# Patient Record
Sex: Female | Born: 1975 | Hispanic: No | Marital: Married | Smoking: Never smoker
Health system: Southern US, Community
[De-identification: ages and names within clinical notes are randomized; demographics above are authoritative.]

## PROBLEM LIST (undated history)

## (undated) DIAGNOSIS — B354 Tinea corporis: Secondary | ICD-10-CM

## (undated) DIAGNOSIS — G5603 Carpal tunnel syndrome, bilateral upper limbs: Secondary | ICD-10-CM

## (undated) DIAGNOSIS — D649 Anemia, unspecified: Secondary | ICD-10-CM

## (undated) DIAGNOSIS — N200 Calculus of kidney: Secondary | ICD-10-CM

## (undated) DIAGNOSIS — R109 Unspecified abdominal pain: Secondary | ICD-10-CM

## (undated) DIAGNOSIS — R1032 Left lower quadrant pain: Secondary | ICD-10-CM

## (undated) DIAGNOSIS — N750 Cyst of Bartholin's gland: Secondary | ICD-10-CM

## (undated) DIAGNOSIS — Z789 Other specified health status: Secondary | ICD-10-CM

## (undated) DIAGNOSIS — F99 Mental disorder, not otherwise specified: Secondary | ICD-10-CM

## (undated) DIAGNOSIS — J309 Allergic rhinitis, unspecified: Secondary | ICD-10-CM

## (undated) DIAGNOSIS — R140 Abdominal distension (gaseous): Secondary | ICD-10-CM

## (undated) DIAGNOSIS — N390 Urinary tract infection, site not specified: Secondary | ICD-10-CM

## (undated) HISTORY — DX: Cyst of Bartholin's gland: N75.0

## (undated) HISTORY — DX: Left lower quadrant pain: R10.32

## (undated) HISTORY — DX: Allergic rhinitis, unspecified: J30.9

## (undated) HISTORY — DX: Unspecified abdominal pain: R10.9

## (undated) HISTORY — DX: Tinea corporis: B35.4

## (undated) HISTORY — DX: Mental disorder, not otherwise specified: F99

## (undated) HISTORY — PX: KIDNEY STONE SURGERY: SHX686

## (undated) HISTORY — DX: Carpal tunnel syndrome, bilateral upper limbs: G56.03

## (undated) HISTORY — DX: Abdominal distension (gaseous): R14.0

---

## 2000-08-17 ENCOUNTER — Other Ambulatory Visit: Admission: RE | Admit: 2000-08-17 | Discharge: 2000-08-17 | Payer: Self-pay | Admitting: *Deleted

## 2001-02-25 ENCOUNTER — Inpatient Hospital Stay (HOSPITAL_COMMUNITY): Admission: AD | Admit: 2001-02-25 | Discharge: 2001-02-25 | Payer: Self-pay | Admitting: Obstetrics

## 2001-02-28 ENCOUNTER — Ambulatory Visit (HOSPITAL_COMMUNITY): Admission: RE | Admit: 2001-02-28 | Discharge: 2001-02-28 | Payer: Self-pay | Admitting: Obstetrics

## 2001-02-28 ENCOUNTER — Encounter: Payer: Self-pay | Admitting: Obstetrics

## 2001-05-09 ENCOUNTER — Inpatient Hospital Stay (HOSPITAL_COMMUNITY): Admission: AD | Admit: 2001-05-09 | Discharge: 2001-05-09 | Payer: Self-pay | Admitting: *Deleted

## 2001-07-03 ENCOUNTER — Inpatient Hospital Stay (HOSPITAL_COMMUNITY): Admission: AD | Admit: 2001-07-03 | Discharge: 2001-07-06 | Payer: Self-pay | Admitting: Obstetrics and Gynecology

## 2001-08-06 ENCOUNTER — Inpatient Hospital Stay (HOSPITAL_COMMUNITY): Admission: AD | Admit: 2001-08-06 | Discharge: 2001-08-06 | Payer: Self-pay | Admitting: *Deleted

## 2003-09-04 ENCOUNTER — Other Ambulatory Visit: Admission: RE | Admit: 2003-09-04 | Discharge: 2003-09-04 | Payer: Self-pay | Admitting: Family Medicine

## 2005-12-27 ENCOUNTER — Ambulatory Visit: Payer: Self-pay | Admitting: Family Medicine

## 2006-01-29 ENCOUNTER — Ambulatory Visit: Payer: Self-pay | Admitting: Family Medicine

## 2006-04-18 ENCOUNTER — Ambulatory Visit: Payer: Self-pay | Admitting: Family Medicine

## 2006-04-27 ENCOUNTER — Ambulatory Visit: Payer: Self-pay | Admitting: *Deleted

## 2006-05-22 ENCOUNTER — Ambulatory Visit: Payer: Self-pay | Admitting: Family Medicine

## 2007-01-16 ENCOUNTER — Encounter (INDEPENDENT_AMBULATORY_CARE_PROVIDER_SITE_OTHER): Payer: Self-pay | Admitting: *Deleted

## 2010-05-01 NOTE — L&D Delivery Note (Signed)
Operative Delivery Note At  a viable female was delivered via NSVD.  Presentation: vertex; Position: Occiput,, Anterior;  Delivery of the head:   First maneuver- McRobert's Second maneuver: suprapubic,   Third maneuver: delivery of posterior arm ,   Dystocia lasted approximately 30 sec.  APGAR: 6, 9; weight 8lb 4oz.   Placenta status:  Cord:  3 cord vessel  Lacerations: periurethral laceration no sutures needed Est. Blood Loss (mL): <500cc  Mom to postpartum.  Baby to with mother in room- stable.  Sid Falcon, CNM attending  CAVINESS,DAWN 11/25/2010, 6:56 PM  I was present for above delivery and agree with note above. [facility]

## 2010-06-20 ENCOUNTER — Encounter (HOSPITAL_COMMUNITY): Payer: Self-pay | Admitting: Radiology

## 2010-06-20 ENCOUNTER — Inpatient Hospital Stay (HOSPITAL_COMMUNITY): Payer: Medicaid Other

## 2010-06-20 ENCOUNTER — Inpatient Hospital Stay (HOSPITAL_COMMUNITY)
Admission: AD | Admit: 2010-06-20 | Discharge: 2010-06-20 | Disposition: A | Discharge status: Home / Self Care | Payer: Medicaid Other | Source: Ambulatory Visit | Attending: Obstetrics & Gynecology | Admitting: Obstetrics & Gynecology

## 2010-06-20 DIAGNOSIS — O9989 Other specified diseases and conditions complicating pregnancy, childbirth and the puerperium: Secondary | ICD-10-CM

## 2010-06-20 DIAGNOSIS — O09529 Supervision of elderly multigravida, unspecified trimester: Secondary | ICD-10-CM

## 2010-06-20 DIAGNOSIS — O0930 Supervision of pregnancy with insufficient antenatal care, unspecified trimester: Secondary | ICD-10-CM

## 2010-06-20 DIAGNOSIS — O99891 Other specified diseases and conditions complicating pregnancy: Secondary | ICD-10-CM | POA: Insufficient documentation

## 2010-06-20 LAB — URINALYSIS, ROUTINE W REFLEX MICROSCOPIC
Bilirubin Urine: NEGATIVE
Hgb urine dipstick: NEGATIVE
Ketones, ur: NEGATIVE mg/dL
Nitrite: NEGATIVE
Protein, ur: NEGATIVE mg/dL
Specific Gravity, Urine: 1.015 (ref 1.005–1.030)
Urine Glucose, Fasting: NEGATIVE mg/dL
Urobilinogen, UA: 1 mg/dL (ref 0.0–1.0)
pH: 8 (ref 5.0–8.0)

## 2010-06-20 LAB — WET PREP, GENITAL
Clue Cells Wet Prep HPF POC: NONE SEEN
Trich, Wet Prep: NONE SEEN
Yeast Wet Prep HPF POC: NONE SEEN

## 2010-06-21 LAB — GC/CHLAMYDIA PROBE AMP, GENITAL
Chlamydia, DNA Probe: NEGATIVE
GC Probe Amp, Genital: NEGATIVE

## 2010-07-18 ENCOUNTER — Other Ambulatory Visit: Payer: Self-pay | Admitting: Family Medicine

## 2010-07-18 DIAGNOSIS — Z3689 Encounter for other specified antenatal screening: Secondary | ICD-10-CM

## 2010-07-19 ENCOUNTER — Ambulatory Visit (HOSPITAL_COMMUNITY)
Admission: RE | Admit: 2010-07-19 | Discharge: 2010-07-19 | Disposition: A | Discharge status: Home / Self Care | Payer: Medicaid Other | Source: Ambulatory Visit | Attending: Family Medicine | Admitting: Family Medicine

## 2010-07-19 DIAGNOSIS — Z3689 Encounter for other specified antenatal screening: Secondary | ICD-10-CM | POA: Insufficient documentation

## 2010-07-19 DIAGNOSIS — O09529 Supervision of elderly multigravida, unspecified trimester: Secondary | ICD-10-CM | POA: Insufficient documentation

## 2010-07-26 ENCOUNTER — Ambulatory Visit (HOSPITAL_COMMUNITY)
Admission: RE | Admit: 2010-07-26 | Discharge: 2010-07-26 | Disposition: A | Discharge status: Home / Self Care | Payer: Medicaid Other | Source: Ambulatory Visit | Attending: Family Medicine | Admitting: Family Medicine

## 2010-08-29 ENCOUNTER — Other Ambulatory Visit: Payer: Self-pay | Admitting: Family Medicine

## 2010-08-29 DIAGNOSIS — O3660X0 Maternal care for excessive fetal growth, unspecified trimester, not applicable or unspecified: Secondary | ICD-10-CM

## 2010-08-29 DIAGNOSIS — Z87442 Personal history of urinary calculi: Secondary | ICD-10-CM

## 2010-08-30 ENCOUNTER — Ambulatory Visit (HOSPITAL_COMMUNITY)
Admission: RE | Admit: 2010-08-30 | Discharge: 2010-08-30 | Disposition: A | Discharge status: Home / Self Care | Payer: Self-pay | Source: Ambulatory Visit | Attending: Family Medicine | Admitting: Family Medicine

## 2010-08-30 DIAGNOSIS — O3660X0 Maternal care for excessive fetal growth, unspecified trimester, not applicable or unspecified: Secondary | ICD-10-CM

## 2010-08-30 DIAGNOSIS — Z87442 Personal history of urinary calculi: Secondary | ICD-10-CM

## 2010-08-30 DIAGNOSIS — M549 Dorsalgia, unspecified: Secondary | ICD-10-CM | POA: Insufficient documentation

## 2010-08-30 DIAGNOSIS — K8020 Calculus of gallbladder without cholecystitis without obstruction: Secondary | ICD-10-CM | POA: Insufficient documentation

## 2010-09-19 ENCOUNTER — Inpatient Hospital Stay (HOSPITAL_COMMUNITY)
Admission: AD | Admit: 2010-09-19 | Discharge: 2010-09-22 | DRG: 778 | Disposition: A | Discharge status: Home / Self Care | Payer: Medicaid Other | Source: Ambulatory Visit | Attending: Obstetrics and Gynecology | Admitting: Obstetrics and Gynecology

## 2010-09-19 DIAGNOSIS — O09529 Supervision of elderly multigravida, unspecified trimester: Secondary | ICD-10-CM

## 2010-09-19 DIAGNOSIS — O4700 False labor before 37 completed weeks of gestation, unspecified trimester: Secondary | ICD-10-CM

## 2010-09-19 LAB — CBC
HCT: 34.2 % — ABNORMAL LOW (ref 36.0–46.0)
Hemoglobin: 11.2 g/dL — ABNORMAL LOW (ref 12.0–15.0)
MCH: 29.9 pg (ref 26.0–34.0)
MCHC: 32.7 g/dL (ref 30.0–36.0)
MCV: 91.2 fL (ref 78.0–100.0)
Platelets: 144 10*3/uL — ABNORMAL LOW (ref 150–400)
RBC: 3.75 MIL/uL — ABNORMAL LOW (ref 3.87–5.11)
RDW: 14.7 % (ref 11.5–15.5)
WBC: 10.5 10*3/uL (ref 4.0–10.5)

## 2010-09-19 LAB — URINALYSIS, ROUTINE W REFLEX MICROSCOPIC
Bilirubin Urine: NEGATIVE
Glucose, UA: NEGATIVE mg/dL
Ketones, ur: NEGATIVE mg/dL
Leukocytes, UA: NEGATIVE
Nitrite: NEGATIVE
Protein, ur: NEGATIVE mg/dL
Specific Gravity, Urine: 1.01 (ref 1.005–1.030)
Urobilinogen, UA: 0.2 mg/dL (ref 0.0–1.0)
pH: 6.5 (ref 5.0–8.0)

## 2010-09-19 LAB — URINE MICROSCOPIC-ADD ON

## 2010-09-19 LAB — RAPID URINE DRUG SCREEN, HOSP PERFORMED
Amphetamines: NOT DETECTED
Benzodiazepines: NOT DETECTED
Cocaine: NOT DETECTED
Opiates: NOT DETECTED
Tetrahydrocannabinol: NOT DETECTED

## 2010-09-19 LAB — WET PREP, GENITAL

## 2010-09-19 LAB — COMPREHENSIVE METABOLIC PANEL
ALT: 17 U/L (ref 0–35)
AST: 24 U/L (ref 0–37)
Albumin: 2.8 g/dL — ABNORMAL LOW (ref 3.5–5.2)
Alkaline Phosphatase: 77 U/L (ref 39–117)
BUN: 6 mg/dL (ref 6–23)
CO2: 22 mEq/L (ref 19–32)
Calcium: 9.5 mg/dL (ref 8.4–10.5)
Chloride: 96 mEq/L (ref 96–112)
Creatinine, Ser: <0.47 mg/dL (ref 0.4–1.2)
Glucose, Bld: 84 mg/dL (ref 70–99)
Potassium: 3.4 mEq/L — ABNORMAL LOW (ref 3.5–5.1)
Sodium: 135 mEq/L (ref 135–145)
Total Bilirubin: 0.7 mg/dL (ref 0.3–1.2)
Total Protein: 6.2 g/dL (ref 6.0–8.3)

## 2010-09-19 LAB — FETAL FIBRONECTIN: Fetal Fibronectin: NEGATIVE

## 2010-09-21 LAB — URINE CULTURE
Colony Count: 2000
Culture  Setup Time: 201205220212

## 2010-09-21 LAB — STREP B DNA PROBE

## 2010-09-29 ENCOUNTER — Other Ambulatory Visit: Payer: Self-pay | Admitting: Obstetrics and Gynecology

## 2010-09-29 DIAGNOSIS — O479 False labor, unspecified: Secondary | ICD-10-CM

## 2010-09-29 DIAGNOSIS — Z331 Pregnant state, incidental: Secondary | ICD-10-CM

## 2010-09-29 LAB — POCT URINALYSIS DIP (DEVICE)
Bilirubin Urine: NEGATIVE
Nitrite: NEGATIVE
pH: 7 (ref 5.0–8.0)

## 2010-09-29 NOTE — Discharge Summary (Addendum)
  NAMEKAYDI, Colleen Warner             ACCOUNT NO.:  [id number]  MEDICAL RECORD NO.:  [id number]           PATIENT TYPE:  I  LOCATION:  9157                          FACILITY:  [facility]  PHYSICIAN:  Horton Chin, MD DATE OF BIRTH:  1976-03-11  DATE OF ADMISSION:  09/19/2010 DATE OF DISCHARGE:  09/22/2010                              DISCHARGE SUMMARY   ADMISSION DIAGNOSES: 1. Intrauterine pregnancy at 29 weeks and 5 days. 2. Threatened preterm labor.  DISCHARGE DIAGNOSES: 1. Intrauterine pregnancy at 30 weeks and 2 days. 2. Threatened preterm labor, resolved.  DISCHARGE MEDICATIONS:  Procardia XL 30 mg 1 tab p.o. b.i.d.  PROCEDURES:  There were no procedures that were performed.  CONSULTATIONS:  There were none.  HISTORY OF PRESENT ILLNESS:  This is a 35 year old gravida 3, para 2 with an intrauterine pregnancy at 29 weeks and 5 days, who presents with threatened preterm labor.  She has a history of 2 term previous vaginal deliveries.  On admission, her cervix was not dilated, but she continued to have contractions despite IV hydration.  Her fetal fibronectin was negative.  All the rest of her labs remained within normal limits.  She was started on magnesium for 48 hours and she did receive betamethasone x2.  Magnesium was subsequently stopped on the morning of Sep 21, 2010. Her contractions then resumed that evening.  Her cervix was then at that time 1-cm dilated, 25% effaced, and -3 station.  Fetal heart rate tracings remained reassuring, so she was then started on Procardia. Then, her contractions decreased significantly.  She was not having any pain.  She was discharged home in stable condition.  DISPOSITION:  Discharged home.  DISCHARGE CONDITION:  Stable.  FOLLOWUP:  The patient is to follow up [facility] on Sep 29, 2010, at 9:45.  ER WARNINGS:  The patient will return to the emergency department with any fever, chills, nausea, vomiting, contractions,  bleeding, spotting, decreased fetal movement, or any other concerning symptoms.    ______________________________ Maryelizabeth Kaufmann, MD   ______________________________ Horton Chin, MD    LC/MEDQ  D:  09/22/2010  T:  09/22/2010  Job:  [id number]  Electronically Signed by Maryelizabeth Kaufmann MD on 09/29/2010 01:46:51 PM Electronically Signed by Jaynie Collins MD on 10/11/2010 05:01:49 PM

## 2010-10-13 ENCOUNTER — Other Ambulatory Visit: Payer: Self-pay | Admitting: Obstetrics & Gynecology

## 2010-10-13 DIAGNOSIS — O09219 Supervision of pregnancy with history of pre-term labor, unspecified trimester: Secondary | ICD-10-CM

## 2010-10-13 LAB — POCT URINALYSIS DIP (DEVICE)
Ketones, ur: NEGATIVE mg/dL
Leukocytes, UA: NEGATIVE
Protein, ur: NEGATIVE mg/dL
Specific Gravity, Urine: 1.015 (ref 1.005–1.030)
pH: 7 (ref 5.0–8.0)

## 2010-10-27 ENCOUNTER — Other Ambulatory Visit: Payer: Self-pay | Admitting: Obstetrics & Gynecology

## 2010-10-27 ENCOUNTER — Other Ambulatory Visit: Payer: Self-pay | Admitting: Physician Assistant

## 2010-10-27 DIAGNOSIS — O3440 Maternal care for other abnormalities of cervix, unspecified trimester: Secondary | ICD-10-CM

## 2010-10-27 DIAGNOSIS — O0930 Supervision of pregnancy with insufficient antenatal care, unspecified trimester: Secondary | ICD-10-CM

## 2010-10-27 DIAGNOSIS — O3660X0 Maternal care for excessive fetal growth, unspecified trimester, not applicable or unspecified: Secondary | ICD-10-CM

## 2010-10-27 DIAGNOSIS — O2200 Varicose veins of lower extremity in pregnancy, unspecified trimester: Secondary | ICD-10-CM

## 2010-10-27 DIAGNOSIS — O09219 Supervision of pregnancy with history of pre-term labor, unspecified trimester: Secondary | ICD-10-CM

## 2010-10-27 LAB — POCT URINALYSIS DIP (DEVICE)
Protein, ur: NEGATIVE mg/dL
Specific Gravity, Urine: 1.02 (ref 1.005–1.030)
Urobilinogen, UA: 0.2 mg/dL (ref 0.0–1.0)

## 2010-11-01 ENCOUNTER — Ambulatory Visit (HOSPITAL_COMMUNITY)
Admission: RE | Admit: 2010-11-01 | Discharge: 2010-11-01 | Disposition: A | Discharge status: Home / Self Care | Payer: Self-pay | Source: Ambulatory Visit | Attending: Obstetrics & Gynecology | Admitting: Obstetrics & Gynecology

## 2010-11-01 DIAGNOSIS — Z3689 Encounter for other specified antenatal screening: Secondary | ICD-10-CM | POA: Insufficient documentation

## 2010-11-01 DIAGNOSIS — O3660X0 Maternal care for excessive fetal growth, unspecified trimester, not applicable or unspecified: Secondary | ICD-10-CM | POA: Insufficient documentation

## 2010-11-02 ENCOUNTER — Inpatient Hospital Stay (HOSPITAL_COMMUNITY)
Admission: AD | Admit: 2010-11-02 | Discharge: 2010-11-03 | Disposition: A | Discharge status: Home / Self Care | Payer: Self-pay | Source: Ambulatory Visit | Attending: Obstetrics & Gynecology | Admitting: Obstetrics & Gynecology

## 2010-11-02 ENCOUNTER — Other Ambulatory Visit: Payer: Self-pay | Admitting: Family Medicine

## 2010-11-02 DIAGNOSIS — N760 Acute vaginitis: Secondary | ICD-10-CM | POA: Insufficient documentation

## 2010-11-02 DIAGNOSIS — B9689 Other specified bacterial agents as the cause of diseases classified elsewhere: Secondary | ICD-10-CM | POA: Insufficient documentation

## 2010-11-02 DIAGNOSIS — O2390 Unspecified genitourinary tract infection in pregnancy, unspecified trimester: Secondary | ICD-10-CM | POA: Insufficient documentation

## 2010-11-02 DIAGNOSIS — A499 Bacterial infection, unspecified: Secondary | ICD-10-CM | POA: Insufficient documentation

## 2010-11-02 LAB — WET PREP, GENITAL

## 2010-11-03 ENCOUNTER — Other Ambulatory Visit: Payer: Self-pay | Admitting: Obstetrics & Gynecology

## 2010-11-03 DIAGNOSIS — O09219 Supervision of pregnancy with history of pre-term labor, unspecified trimester: Secondary | ICD-10-CM

## 2010-11-03 DIAGNOSIS — O09529 Supervision of elderly multigravida, unspecified trimester: Secondary | ICD-10-CM

## 2010-11-03 DIAGNOSIS — O2200 Varicose veins of lower extremity in pregnancy, unspecified trimester: Secondary | ICD-10-CM

## 2010-11-03 DIAGNOSIS — O0930 Supervision of pregnancy with insufficient antenatal care, unspecified trimester: Secondary | ICD-10-CM

## 2010-11-10 ENCOUNTER — Other Ambulatory Visit: Payer: Self-pay | Admitting: Family Medicine

## 2010-11-10 ENCOUNTER — Other Ambulatory Visit: Payer: Self-pay | Admitting: Obstetrics and Gynecology

## 2010-11-10 DIAGNOSIS — O09219 Supervision of pregnancy with history of pre-term labor, unspecified trimester: Secondary | ICD-10-CM

## 2010-11-10 DIAGNOSIS — O0930 Supervision of pregnancy with insufficient antenatal care, unspecified trimester: Secondary | ICD-10-CM

## 2010-11-11 LAB — CULTURE, OB URINE
Colony Count: NO GROWTH
Organism ID, Bacteria: NO GROWTH

## 2010-11-15 ENCOUNTER — Encounter (HOSPITAL_COMMUNITY): Payer: Self-pay | Admitting: *Deleted

## 2010-11-15 ENCOUNTER — Inpatient Hospital Stay (HOSPITAL_COMMUNITY)
Admission: AD | Admit: 2010-11-15 | Discharge: 2010-11-15 | Disposition: A | Discharge status: Home / Self Care | Payer: Self-pay | Source: Ambulatory Visit | Attending: Obstetrics and Gynecology | Admitting: Obstetrics and Gynecology

## 2010-11-15 DIAGNOSIS — O228X9 Other venous complications in pregnancy, unspecified trimester: Secondary | ICD-10-CM | POA: Insufficient documentation

## 2010-11-15 DIAGNOSIS — O479 False labor, unspecified: Secondary | ICD-10-CM | POA: Insufficient documentation

## 2010-11-15 DIAGNOSIS — K649 Unspecified hemorrhoids: Secondary | ICD-10-CM | POA: Insufficient documentation

## 2010-11-15 NOTE — Progress Notes (Signed)
CALLED DR Gwenlyn Saran-

## 2010-11-15 NOTE — Progress Notes (Signed)
DENIES HSV AND MRSA.  STARTED HURTING BAD AT  2300

## 2010-11-15 NOTE — Progress Notes (Signed)
USING INTERPRETER- DEBBIE

## 2010-11-15 NOTE — Progress Notes (Signed)
Pt reports per interpreter that she has had contractions since 5pm, denies bleeding or Leaking fluid. G3p2.

## 2010-11-15 NOTE — Progress Notes (Signed)
DR Gwenlyn Saran-  IN ROOM-

## 2010-11-16 NOTE — ED Provider Notes (Signed)
History   Chief Complaint:  Labor Eval   Colleen Warner is  35 y.o. [id number] Patient's last menstrual period was 01/18/2010.Marland Kitchen  Her pregnancy status is positive.  She is [redacted]w[redacted]d by early ultrasound.  She presents complaining of pain and itching with bowel movement and contractions. She describes itching and a feeling of buldging with bowel movement that started today. She denies any constipation. Denies blood per rectum. She also started feeling contractions at 5pm, worsening at 11pm tonight, feeling them every 5 to .  Her pregnancy has been complicated by an epidsode of early contractions for which she was admitted in May and given a month Rx of Procardia XL 30mg  bid.   OB History    Grav Para Term Preterm Abortions TAB SAB Ect Mult Living   3 2 2       2        No past medical history on file.  No past surgical history on file.  No family history on file.  History  Substance Use Topics  . Smoking status: Not on file  . Smokeless tobacco: Not on file  . Alcohol Use: Not on file    Allergies: No Known Allergies  No prescriptions prior to admission    Review of Systems - Negative except per HPI  Physical Exam   Blood pressure 113/66, pulse 100, temperature 99.2 F (37.3 C), temperature source Oral, resp. rate 20, height 4\' 10"  (1.473 m), weight 162 lb (73.483 kg), last menstrual period 01/18/2010.  General: General appearance - alert, well appearing, and in no distress Chest - clear to auscultation, no wheezes, rales or rhonchi, symmetric air entry Heart - normal rate, regular rhythm, normal S1, S2, no murmurs, rubs, clicks or gallops Abdomen - gravid Extremities - peripheral pulses normal, no pedal edema, no clubbing or cyanosis Rectal exam: two adjacent 1.5cm non thrombosed external hemorrhoids. Focused Gynecological Exam: SVE: 2.5/80/-1 SVE 1hr later: unchanged  Labs: No results found for this or any previous visit (from the past 24 hour(s)).  Ultrasound  Studies: (NOTE: Ultrasound reports are not yet able to be directly imported into notes.  Please "CUT" this portion of the note and either free text the report or "COPY/PASTE" from the report viewer.)  Assessment: 35 yo G3P2002 at 38wga who presents with contractions and hemorrhoids   Plan: 1. Labor eval: patient not in active labor. Pt instructed to return to hospital if increased severity of contractions, if any leakage of fluid. 2. Hemorrhoids: pt reassured of benign nature of hemorrhoids. Pt advised to use preparation H and keep soft bowel movements with increased fiber intake.  Discharge Medications: Discharge Medication List as of 11/15/2010  4:37 AM    CONTINUE these medications which have NOT CHANGED   Details  prenatal vitamin w/FE, FA (PRENATAL 1 + 1) 27-1 MG TABS Take 1 tablet by mouth daily.  , Until Discontinued, Historical Med         Marena Chancy 11/16/2010, 3:43 AM

## 2010-11-17 DIAGNOSIS — O0930 Supervision of pregnancy with insufficient antenatal care, unspecified trimester: Secondary | ICD-10-CM

## 2010-11-17 DIAGNOSIS — O09219 Supervision of pregnancy with history of pre-term labor, unspecified trimester: Secondary | ICD-10-CM

## 2010-11-18 ENCOUNTER — Other Ambulatory Visit: Payer: Self-pay | Admitting: Obstetrics & Gynecology

## 2010-11-24 ENCOUNTER — Inpatient Hospital Stay (HOSPITAL_COMMUNITY)
Admission: AD | Admit: 2010-11-24 | Discharge: 2010-11-27 | DRG: 775 | Disposition: A | Discharge status: Home / Self Care | Payer: Medicaid Other | Source: Ambulatory Visit | Attending: Obstetrics & Gynecology | Admitting: Obstetrics & Gynecology

## 2010-11-24 ENCOUNTER — Other Ambulatory Visit: Payer: Self-pay | Admitting: Obstetrics & Gynecology

## 2010-11-24 ENCOUNTER — Encounter (HOSPITAL_COMMUNITY): Payer: Self-pay

## 2010-11-24 ENCOUNTER — Inpatient Hospital Stay (HOSPITAL_COMMUNITY)
Admission: AD | Admit: 2010-11-24 | Discharge: 2010-11-24 | Disposition: A | Discharge status: Home / Self Care | Payer: Self-pay | Source: Ambulatory Visit | Attending: Obstetrics & Gynecology | Admitting: Obstetrics & Gynecology

## 2010-11-24 DIAGNOSIS — O479 False labor, unspecified: Secondary | ICD-10-CM | POA: Insufficient documentation

## 2010-11-24 DIAGNOSIS — O09219 Supervision of pregnancy with history of pre-term labor, unspecified trimester: Secondary | ICD-10-CM

## 2010-11-24 DIAGNOSIS — O2200 Varicose veins of lower extremity in pregnancy, unspecified trimester: Secondary | ICD-10-CM

## 2010-11-24 DIAGNOSIS — O471 False labor at or after 37 completed weeks of gestation: Secondary | ICD-10-CM

## 2010-11-24 DIAGNOSIS — O09529 Supervision of elderly multigravida, unspecified trimester: Secondary | ICD-10-CM | POA: Diagnosis present

## 2010-11-24 DIAGNOSIS — O3440 Maternal care for other abnormalities of cervix, unspecified trimester: Secondary | ICD-10-CM

## 2010-11-24 DIAGNOSIS — O0930 Supervision of pregnancy with insufficient antenatal care, unspecified trimester: Secondary | ICD-10-CM

## 2010-11-24 HISTORY — DX: Other specified health status: Z78.9

## 2010-11-24 HISTORY — DX: Calculus of kidney: N20.0

## 2010-11-24 HISTORY — DX: Urinary tract infection, site not specified: N39.0

## 2010-11-24 HISTORY — DX: Anemia, unspecified: D64.9

## 2010-11-24 NOTE — Progress Notes (Signed)
Monitors d/c'd per VSmith, CNM who reviewed reactive efm tracing. Pt to walk x1 hour, then recheck cervix.

## 2010-11-24 NOTE — Progress Notes (Signed)
11/24/2010 Colleen Warner  Interpreter  I assisted Maury Dus in triage

## 2010-11-24 NOTE — ED Provider Notes (Signed)
Colleen Warner is a 35 y.o. female sent to MAU from [facility] clinic for labor check. Maternal Medical History:  Reason for admission: Reason for admission: contractions.  Contractions: Onset was 3-5 hours ago.   Frequency: regular.   Duration is approximately 60 seconds.   Perceived severity is moderate.    Fetal activity: Perceived fetal activity is normal.    Prenatal Complications - Diabetes: none.    OB History    Grav Para Term Preterm Abortions TAB SAB Ect Mult Living   3 2 2       2      No past medical history on file. No past surgical history on file. Family History: family history is not on file. Social History:  does not have a smoking history on file. She does not have any smokeless tobacco history on file. Her alcohol and drug histories not on file.  Review of Systems  All other systems reviewed and are negative.  Blood pressure 121/82, pulse 111, temperature 98.2 F (36.8 C), temperature source Oral, resp. rate 16, height 4' 9.5" (1.461 m), weight 73.573 kg (162 lb 3.2 oz), last menstrual period 01/18/2010, SpO2 99.00%. Maternal Exam:  Uterine Assessment: Contraction strength is moderate.  Contraction duration is 60 seconds. Contraction frequency is regular.   Abdomen: Patient reports no abdominal tenderness. Fundal height is S=D.   Fetal presentation: vertex  Introitus: Normal vulva. Normal vagina.  Pelvis: adequate for delivery.   Cervix: Cervix evaluated by digital exam.     Fetal Exam Fetal Monitor Review: Mode: ultrasound.   Baseline rate: 140-150.  Variability: moderate (6-25 bpm).   Pattern: accelerations present and no decelerations.    Fetal State Assessment: Category I - tracings are normal.    Dilation: 4.5 Effacement (%): 80 Cervical Position: Middle Station: -2 Presentation: Vertex Exam by:: [name], CNM  Physical Exam  Constitutional: She is oriented to person, place, and time. She appears well-developed and well-nourished.   Cardiovascular: Normal rate.   Respiratory: Effort normal.  GI: Soft.  Genitourinary: Vagina normal and uterus normal.  Neurological: She is alert and oriented to person, place, and time. She has normal reflexes.  Skin: Skin is warm and dry.    Prenatal labs: ABO, Rh:  O pos Antibody:  neg Rubella:  Immune RPR:   NR HBsAg:   neg HIV:   NR GBS: NEGATIVE (05/21 2340)  1 hour GTT elevated, 3 hour GTT normal  Assessment/Plan: Minimal cervical change Gave pt option of D/C or admit considering Hx rapid labor, multip, advanced dilation, unsure. Will recheck cervix in 1 hour and decide. Report given to M. Maris Berger   [place], [place] 11/24/2010, 11:45am

## 2010-11-24 NOTE — Progress Notes (Signed)
Per interpreter, pt ROM at 2330, occassional mild contraction per pt

## 2010-11-24 NOTE — Progress Notes (Signed)
11/24/10 1028  Cervical Exam  Dilation 4.5  Effacement (%) 70  Cervical Position Posterior  Cervical Consistency Medium  Vag. Bleeding None  Clots None  Station Ballotable  Presentation Vertex  Exam by: VSmith, CNM  Membranes  Membrane Status Intact  Amount None    35 year-old G3P2002 at 39.1 sent from clinic for VE 4 cm, UC's Q 4-7 getting stronger. Denies vaginal bleeding or leaking of fluid. +FM, decreased this morning, but normal now.  Assessment: No clear change in VE. Pt in no distress w/ UC's.  Plan:  1. Recheck in 1 hour.  2. Pt may ambulate

## 2010-11-24 NOTE — ED Provider Notes (Signed)
History     Chief Complaint  Patient presents with  . Contractions   HPI Has been here for labor eval. THis is the second recheck at over 2+ hours post first check.  Pt states contractions have lessened considerably.    Allergies: No Known Allergies  Prescriptions prior to admission  Medication Sig Dispense Refill  . amoxicillin (AMOXIL) 500 MG tablet Take 500 mg by mouth 3 (three) times daily. PATIENT IS TAKING FOR 7 DAYS STARTED 11/22/10       . prenatal vitamin w/FE, FA (PRENATAL 1 + 1) 27-1 MG TABS Take 1 tablet by mouth daily.          ROS Physical Exam   Blood pressure 121/82, pulse 111, temperature 98.2 F (36.8 C), temperature source Oral, resp. rate 16, height 4' 9.5" (1.461 m), weight 162 lb 3.2 oz (73.573 kg), last menstrual period 01/18/2010, SpO2 99.00%.  Physical Exam  Dilation: 5 Effacement (%): 80 Cervical Position: Middle Station: -2 Presentation: Vertex Exam by:: Colleen Warner, CNM  No change in cervix by my exam.   MAU Course  Procedures   Assessment and Plan  Pt states she is comfortable with going home. I explained that when contractions resume, the dilation will happen much faster, so she should not delay in returning to hospital. States she lives close by.  Colleen Warner 11/24/2010, 1:08 PM

## 2010-11-24 NOTE — Progress Notes (Signed)
Pt was seen in the clinic this morning and was 4 cm. Sent to MAU for monitoring.

## 2010-11-24 NOTE — Progress Notes (Signed)
11/24/2010 Dent Plantz  Interpreter  I assisted [facility] with plan of care.

## 2010-11-25 ENCOUNTER — Encounter (HOSPITAL_COMMUNITY): Payer: Self-pay | Admitting: *Deleted

## 2010-11-25 DIAGNOSIS — O09529 Supervision of elderly multigravida, unspecified trimester: Secondary | ICD-10-CM

## 2010-11-25 DIAGNOSIS — N390 Urinary tract infection, site not specified: Secondary | ICD-10-CM | POA: Insufficient documentation

## 2010-11-25 LAB — CULTURE, OB URINE: Colony Count: >=100000

## 2010-11-25 LAB — HEPATITIS B SURFACE ANTIGEN: Hepatitis B Surface Ag: NEGATIVE

## 2010-11-25 LAB — ABO/RH: ABO/RH(D): O POS

## 2010-11-25 LAB — CBC
HCT: 36.3 % (ref 36.0–46.0)
Hemoglobin: 12.2 g/dL (ref 12.0–15.0)
MCH: 30.6 pg (ref 26.0–34.0)
MCHC: 33.6 g/dL (ref 30.0–36.0)
MCV: 91 fL (ref 78.0–100.0)
RBC: 3.99 MIL/uL (ref 3.87–5.11)

## 2010-11-25 LAB — RUBELLA ANTIBODY, IGM: Rubella: IMMUNE

## 2010-11-25 MED ORDER — HYDROXYZINE HCL 50 MG PO TABS
50.0000 mg | ORAL_TABLET | Freq: Four times a day (QID) | ORAL | Status: DC | PRN
  Filled 2010-11-25: qty 1

## 2010-11-25 MED ORDER — LACTATED RINGERS IV SOLN: 500.0000 mL | INTRAVENOUS | Status: DC | PRN

## 2010-11-25 MED ORDER — BENZOCAINE-MENTHOL 20-0.5 % EX AERO
INHALATION_SPRAY | CUTANEOUS | Status: AC
  Administered 2010-11-25: 1 via TOPICAL
  Filled 2010-11-25: qty 56

## 2010-11-25 MED ORDER — IBUPROFEN 600 MG PO TABS: 600.0000 mg | ORAL_TABLET | Freq: Four times a day (QID) | ORAL | Status: DC | PRN

## 2010-11-25 MED ORDER — BENZOCAINE-MENTHOL 20-0.5 % EX AERO
1.0000 "application " | INHALATION_SPRAY | CUTANEOUS | Status: DC | PRN
  Administered 2010-11-25: 1 via TOPICAL

## 2010-11-25 MED ORDER — SIMETHICONE 80 MG PO CHEW: 80.0000 mg | CHEWABLE_TABLET | ORAL | Status: DC | PRN

## 2010-11-25 MED ORDER — FLEET ENEMA 7-19 GM/118ML RE ENEM: 1.0000 | ENEMA | RECTAL | Status: DC | PRN

## 2010-11-25 MED ORDER — NALBUPHINE SYRINGE 5 MG/0.5 ML: 5.0000 mg | INJECTION | INTRAMUSCULAR | Status: DC | PRN

## 2010-11-25 MED ORDER — PENICILLIN G POTASSIUM 5000000 UNITS IJ SOLR
2.5000 10*6.[IU] | INTRAVENOUS | Status: DC
  Filled 2010-11-25 (×4): qty 2.5

## 2010-11-25 MED ORDER — ZOLPIDEM TARTRATE 5 MG PO TABS: 5.0000 mg | ORAL_TABLET | Freq: Every evening | ORAL | Status: DC | PRN

## 2010-11-25 MED ORDER — OXYTOCIN 20 UNITS IN LACTATED RINGERS INFUSION - SIMPLE
1.0000 m[IU]/min | INTRAVENOUS | Status: DC
  Administered 2010-11-25: 12 m[IU]/min via INTRAVENOUS

## 2010-11-25 MED ORDER — HYDROXYZINE HCL 50 MG/ML IM SOLN: 50.0000 mg | Freq: Four times a day (QID) | INTRAMUSCULAR | Status: DC | PRN

## 2010-11-25 MED ORDER — ONDANSETRON HCL 4 MG PO TABS: 4.0000 mg | ORAL_TABLET | ORAL | Status: DC | PRN

## 2010-11-25 MED ORDER — OXYCODONE-ACETAMINOPHEN 5-325 MG PO TABS: 2.0000 | ORAL_TABLET | ORAL | Status: DC | PRN

## 2010-11-25 MED ORDER — WITCH HAZEL-GLYCERIN EX PADS: MEDICATED_PAD | CUTANEOUS | Status: DC | PRN

## 2010-11-25 MED ORDER — LACTATED RINGERS IV SOLN
INTRAVENOUS | Status: DC
  Administered 2010-11-25 (×3): via INTRAVENOUS

## 2010-11-25 MED ORDER — EPHEDRINE 5 MG/ML INJ: 10.0000 mg | INTRAVENOUS | Status: DC | PRN

## 2010-11-25 MED ORDER — DIPHENHYDRAMINE HCL 50 MG/ML IJ SOLN: 12.5000 mg | INTRAMUSCULAR | Status: DC | PRN

## 2010-11-25 MED ORDER — PHENYLEPHRINE 40 MCG/ML (10ML) SYRINGE FOR IV PUSH (FOR BLOOD PRESSURE SUPPORT)
80.0000 ug | PREFILLED_SYRINGE | INTRAVENOUS | Status: DC | PRN
  Filled 2010-11-25: qty 5

## 2010-11-25 MED ORDER — OXYCODONE-ACETAMINOPHEN 5-325 MG PO TABS: 1.0000 | ORAL_TABLET | ORAL | Status: DC | PRN

## 2010-11-25 MED ORDER — ONDANSETRON HCL 4 MG/2ML IJ SOLN: 4.0000 mg | Freq: Four times a day (QID) | INTRAMUSCULAR | Status: DC | PRN

## 2010-11-25 MED ORDER — OXYTOCIN 20 UNITS IN LACTATED RINGERS INFUSION - SIMPLE: 125.0000 mL/h | Freq: Once | INTRAVENOUS | Status: DC

## 2010-11-25 MED ORDER — TETANUS-DIPHTH-ACELL PERTUSSIS 5-2.5-18.5 LF-MCG/0.5 IM SUSP
0.5000 mL | Freq: Once | INTRAMUSCULAR | Status: AC
  Administered 2010-11-26: 0.5 mL via INTRAMUSCULAR
  Filled 2010-11-25: qty 0.5

## 2010-11-25 MED ORDER — LACTATED RINGERS IV SOLN
500.0000 mL | INTRAVENOUS | Status: DC | PRN
  Administered 2010-11-25: 500 mL via INTRAVENOUS

## 2010-11-25 MED ORDER — LANOLIN HYDROUS EX OINT: TOPICAL_OINTMENT | CUTANEOUS | Status: DC | PRN

## 2010-11-25 MED ORDER — PRENATAL PLUS 27-1 MG PO TABS
1.0000 | ORAL_TABLET | Freq: Every day | ORAL | Status: DC
  Administered 2010-11-26 – 2010-11-27 (×2): 1 via ORAL
  Filled 2010-11-25 (×2): qty 1

## 2010-11-25 MED ORDER — ZOLPIDEM TARTRATE 10 MG PO TABS: 10.0000 mg | ORAL_TABLET | Freq: Every evening | ORAL | Status: DC | PRN

## 2010-11-25 MED ORDER — ONDANSETRON HCL 4 MG/2ML IJ SOLN: 4.0000 mg | INTRAMUSCULAR | Status: DC | PRN

## 2010-11-25 MED ORDER — LIDOCAINE HCL (PF) 1 % IJ SOLN: 30.0000 mL | INTRAMUSCULAR | Status: DC | PRN

## 2010-11-25 MED ORDER — OXYTOCIN 20 UNITS IN LACTATED RINGERS INFUSION - SIMPLE
1.0000 m[IU]/min | INTRAVENOUS | Status: DC
  Administered 2010-11-25: 2 m[IU]/min via INTRAVENOUS
  Filled 2010-11-25: qty 1000

## 2010-11-25 MED ORDER — LIDOCAINE HCL (PF) 1 % IJ SOLN
30.0000 mL | INTRAMUSCULAR | Status: DC | PRN
  Filled 2010-11-25: qty 30

## 2010-11-25 MED ORDER — ACETAMINOPHEN 325 MG PO TABS: 650.0000 mg | ORAL_TABLET | ORAL | Status: DC | PRN

## 2010-11-25 MED ORDER — IBUPROFEN 600 MG PO TABS
600.0000 mg | ORAL_TABLET | Freq: Four times a day (QID) | ORAL | Status: DC
  Administered 2010-11-25 – 2010-11-27 (×7): 600 mg via ORAL
  Filled 2010-11-25 (×7): qty 1

## 2010-11-25 MED ORDER — CITRIC ACID-SODIUM CITRATE 334-500 MG/5ML PO SOLN: 30.0000 mL | ORAL | Status: DC | PRN

## 2010-11-25 MED ORDER — DIPHENHYDRAMINE HCL 25 MG PO CAPS: 25.0000 mg | ORAL_CAPSULE | Freq: Four times a day (QID) | ORAL | Status: DC | PRN

## 2010-11-25 MED ORDER — SENNOSIDES-DOCUSATE SODIUM 8.6-50 MG PO TABS
1.0000 | ORAL_TABLET | Freq: Every day | ORAL | Status: DC
  Administered 2010-11-25 – 2010-11-26 (×2): 1 via ORAL

## 2010-11-25 MED ORDER — OXYTOCIN 10 UNIT/ML IJ SOLN
INTRAMUSCULAR | Status: AC
  Filled 2010-11-25: qty 2

## 2010-11-25 MED ORDER — FENTANYL 2.5 MCG/ML BUPIVACAINE 1/10 % EPIDURAL INFUSION (WH - ANES)
14.0000 mL/h | INTRAMUSCULAR | Status: DC
  Filled 2010-11-25: qty 60

## 2010-11-25 MED ORDER — NALBUPHINE SYRINGE 5 MG/0.5 ML
5.0000 mg | INJECTION | INTRAMUSCULAR | Status: DC | PRN
  Administered 2010-11-25 (×3): 5 mg via INTRAVENOUS
  Filled 2010-11-25 (×4): qty 0.5

## 2010-11-25 MED ORDER — PHENYLEPHRINE 40 MCG/ML (10ML) SYRINGE FOR IV PUSH (FOR BLOOD PRESSURE SUPPORT): 80.0000 ug | PREFILLED_SYRINGE | INTRAVENOUS | Status: DC | PRN

## 2010-11-25 MED ORDER — LACTATED RINGERS IV SOLN: INTRAVENOUS | Status: DC

## 2010-11-25 MED ORDER — LACTATED RINGERS IV SOLN: 500.0000 mL | Freq: Once | INTRAVENOUS | Status: DC

## 2010-11-25 MED ORDER — TERBUTALINE SULFATE 1 MG/ML IJ SOLN: 0.2500 mg | Freq: Once | INTRAMUSCULAR | Status: DC | PRN

## 2010-11-25 MED ORDER — EPHEDRINE 5 MG/ML INJ
10.0000 mg | INTRAVENOUS | Status: DC | PRN
  Filled 2010-11-25: qty 4

## 2010-11-25 MED ORDER — PENICILLIN G POTASSIUM 5000000 UNITS IJ SOLR
5.0000 10*6.[IU] | Freq: Once | INTRAVENOUS | Status: DC
  Filled 2010-11-25: qty 5

## 2010-11-25 NOTE — Progress Notes (Signed)
  Colleen Warner is a 35 y.o. G3P2002 at [redacted]w[redacted]d by ultrasound admitted for rupture of membranes   Subjective:   Objective: BP 106/73  Pulse 88  Temp(Src) 98.1 F (36.7 C) (Oral)  Resp 20  Ht 4\' 10"  (1.473 m)  Wt 162 lb (73.483 kg)  BMI 33.86 kg/m2  LMP 01/18/2010 I/O last 3 completed shifts: In: -  Out: 1 [Urine:1]    FHT:  140, mod variability, accels present, no decels UC:   regular, every 5-6 minutes SVE:   Dilation: 5.5 Effacement (%): 80 Station: -1 Exam by:: Dr. Edmonia James  Labs: Lab Results  Component Value Date   WBC 9.0 11/25/2010   HGB 12.2 11/25/2010   HCT 36.3 11/25/2010   MCV 91.0 11/25/2010   PLT 161 11/25/2010    Assessment / Plan: Protracted latent phase, now on pitocin  Labor: pitocin started at 0500, titrate up per protocol Fetal Wellbeing:  Category I Pain Control:  Labor support without medications, pt aware of pain management options. I/D:  GBS negative Anticipated MOD:  NSVD  Colleen Warner 11/25/2010, 9:31 AM

## 2010-11-25 NOTE — Progress Notes (Signed)
     Kimiah Archaga-Umanzor is a 35 y.o. G3P2002 at [redacted]w[redacted]d by ultrasound admitted for rupture of membranes   Subjective:   Objective: BP 110/76  Pulse 79  Temp(Src) 98 F (36.7 C) (Oral)  Resp 18  Ht 4\' 10"  (1.473 m)  Wt 162 lb (73.483 kg)  BMI 33.86 kg/m2  LMP 01/18/2010 I/O last 3 completed shifts: In: -  Out: 1 [Urine:1]    FHT:  140, mod variability, accels present, no decels UC:   regular, every 5-6 minutes SVE:   Dilation: 7 Effacement (%): 90 Station: -1 Exam by:: L.Lyons, RN Cervix unchanged.   Labs: Lab Results  Component Value Date   WBC 9.0 11/25/2010   HGB 12.2 11/25/2010   HCT 36.3 11/25/2010   MCV 91.0 11/25/2010   PLT 161 11/25/2010    Assessment / Plan: Protracted latent phase, now on pitocin  Labor: pitocin started at 0500am, titrate up per protocol Fetal Wellbeing:  Category I Pain Control:  Labor support with IV pain medications, pt aware of pain management options.- epidural ordered if needed,  I/D:  GBS negative Anticipated MOD:  NSVD  Philomena Buttermore 11/25/2010, 4:06 PM

## 2010-11-25 NOTE — Progress Notes (Signed)
    Colleen Warner is a 35 y.o. G3P2002 at [redacted]w[redacted]d by ultrasound admitted for rupture of membranes   Subjective:   Objective: BP 119/71  Pulse 83  Temp(Src) 98 F (36.7 C) (Oral)  Resp 18  Ht 4\' 10"  (1.473 m)  Wt 162 lb (73.483 kg)  BMI 33.86 kg/m2  LMP 01/18/2010 I/O last 3 completed shifts: In: -  Out: 1 [Urine:1]    FHT:  140, mod variability, accels present, no decels UC:   regular, every 5-6 minutes SVE:   Dilation: 6 Effacement (%): 80 Station: -2 Exam by:: dr. Edmonia James Cervix unchanged.   Labs: Lab Results  Component Value Date   WBC 9.0 11/25/2010   HGB 12.2 11/25/2010   HCT 36.3 11/25/2010   MCV 91.0 11/25/2010   PLT 161 11/25/2010    Assessment / Plan: Protracted latent phase, now on pitocin  Labor: pitocin started at 0500, titrate up per protocol Fetal Wellbeing:  Category I Pain Control:  Labor support without medications, pt aware of pain management options.- epidural ordered if needed. I/D:  GBS negative Anticipated MOD:  NSVD  Colleen Warner 11/25/2010, 1:01 PM

## 2010-11-25 NOTE — Progress Notes (Signed)
Report called to birthing suites charge RN.  Notified of grossly ruptured pt.  Pt may go to room 166.

## 2010-11-25 NOTE — H&P (Signed)
Chief Complaint:  Spontaneous rupture of membranes  HPI:   Colleen Warner is a 35 y.o. [id number]  with Estimated Date of Delivery: 11/30/10 by midtrimester ultrasound , now at  [redacted]w[redacted]d weeks gestation who presents with complaint of  contractions every a few minutes lasting 40 seconds. Patient reports the fetal movement as active,  vaginal bleeding as none and  she describes fluid per vagina as Clear.  Other associated symptoms include fluid leakage.    She receives her prenatal care at  [facility] and her prenatal course has been complicated by preterm labor   Past Obstetrical History: OB History    Grav Para Term Preterm Abortions TAB SAB Ect Mult Living   3 2 2       2       Past Medical History: No past medical history on file.  Past Surgical History: No past surgical history on file.  Family History: No family history on file.  Social History: History  Substance Use Topics  . Smoking status: Not on file  . Smokeless tobacco: Not on file  . Alcohol Use: Not on file    Allergies: No Known Allergies  Home Medications: Prescriptions prior to admission  Medication Sig Dispense Refill  . amoxicillin (AMOXIL) 500 MG tablet Take 500 mg by mouth 3 (three) times daily. PATIENT IS TAKING FOR 7 DAYS STARTED 11/22/10       . prenatal vitamin w/FE, FA (PRENATAL 1 + 1) 27-1 MG TABS Take 1 tablet by mouth daily.          General ROS:  negative Focused OB Physical:  HRR&R, LCTAB; abd soft, nontender, legs negative Cervical Exam:   Fetal presentation: cephalic Membranes:intact  External Fetal Monitoring:  Baseline: 130 bpm and contractions are irregular, every 5-6 minuteswith mild intensity.  Labs: No results found for this or any previous visit (from the past 24 hour(s)).  ASSESSMENT: Colleen Warner [id number] [redacted]w[redacted]d at weeks gestation  SROM/early labor  PLAN: Expectant management  CRESENZO-DISHMAN,Emmi Wertheim 11/25/2010,12:19 AM

## 2010-11-25 NOTE — Progress Notes (Signed)
   Colleen Warner is a 35 y.o. G3P2002 at [redacted]w[redacted]d by ultrasound admitted for rupture of membranes   Subjective:   Objective: BP 115/74  Pulse 86  Temp(Src) 98.7 F (37.1 C) (Oral)  Resp 18  Ht 4\' 10"  (1.473 m)  Wt 162 lb (73.483 kg)  BMI 33.86 kg/m2  LMP 01/18/2010 I/O last 3 completed shifts: In: -  Out: 1 [Urine:1]    FHT:  140, mod variability, accels present, no decels UC:   regular, every 5-6 minutes SVE:   Dilation: 5.5 Effacement (%): 80 Station: -1 Exam by:: Dr. Edmonia James  Labs: Lab Results  Component Value Date   WBC 9.0 11/25/2010   HGB 12.2 11/25/2010   HCT 36.3 11/25/2010   MCV 91.0 11/25/2010   PLT 161 11/25/2010    Assessment / Plan: Protracted latent phase, now on pitocin  Labor: pitocin started at 0500, titrate up per protocol Fetal Wellbeing:  Category I Pain Control:  Labor support without medications, pt aware of pain management options. I/D:  GBS negative Anticipated MOD:  NSVD  Colleen Warner 11/25/2010, 11:23 AM

## 2010-11-25 NOTE — Progress Notes (Signed)
Colleen Warner is a 35 y.o. G3P2002 at [redacted]w[redacted]d by ultrasound admitted for rupture of membranes   Subjective:   Objective: BP 122/80  Pulse 99  Temp(Src) 98.5 F (36.9 C) (Oral)  Resp 20  Ht 4\' 10"  (1.473 m)  Wt 73.483 kg (162 lb)  BMI 33.86 kg/m2  LMP 01/18/2010   I/O this shift: In: -  Out: 1 [Urine:1]  FHT:  140, avg LTV avg, accels present, no decels UC:   irregular, every 5-7 minutes SVE:   Dilation: 5 Effacement (%): 80 Station: -2 Exam by:: Rfialkiewicz,RNC  Labs: Lab Results  Component Value Date   WBC 9.0 11/25/2010   HGB 12.2 11/25/2010   HCT 36.3 11/25/2010   MCV 91.0 11/25/2010   PLT 161 11/25/2010    Assessment / Plan: Protracted latent phase  Labor: pitocin started at 0500, on 6 mu/min Preeclampsia:   Fetal Wellbeing:  Category I Pain Control:  Labor support without medications I/D:   Anticipated MOD:  NSVD  CRESENZO-DISHMAN,Colleen Warner 11/25/2010, 6:25 AM

## 2010-11-25 NOTE — Initial Assessments (Signed)
When care was taken over pitocin was reported to be at 8 mU/min. When in to assess pt, rate was found to be at 2 mU/min and increased by this RN to 4 mU/min @ 0830.

## 2010-11-25 NOTE — Progress Notes (Signed)
Colleen Warner is a 35 y.o. G3P2002 at [redacted]w[redacted]d by ultrasound admitted for rupture of membranes  Subjective:   Objective: BP 131/83  Pulse 99  Temp(Src) 98.3 F (36.8 C) (Oral)  Resp 18  Ht 4\' 10"  (1.473 m)  Wt 73.483 kg (162 lb)  BMI 33.86 kg/m2  LMP 01/18/2010      FHT:  FHR: 140 bpm, variability: moderate,  accelerations:  Present,  decelerations:  Absent UC:   irregular, every 5-7  minutes SVE:   Dilation: 5 Effacement (%): 80 Station: -2 Exam by:: fran cresenzo-dishmon, cnm  Labs: Lab Results  Component Value Date   WBC 9.0 11/25/2010   HGB 12.2 11/25/2010   HCT 36.3 11/25/2010   MCV 91.0 11/25/2010   PLT 161 11/25/2010    Assessment / Plan: SROM, beginning to contract  Labor: Pt prefers to see if labor will start Preeclampsia:   Fetal Wellbeing:  Category I Pain Control:  Labor support without medications I/D:    Anticipated MOD:  NSVD  CRESENZO-DISHMAN,Makena Murdock 11/25/2010, 1:29 AM

## 2010-11-26 MED ORDER — AMOXICILLIN 500 MG PO CAPS
500.0000 mg | ORAL_CAPSULE | Freq: Three times a day (TID) | ORAL | Status: DC
Start: 2010-11-26 — End: 2010-11-27
  Administered 2010-11-26 – 2010-11-27 (×4): 500 mg via ORAL
  Filled 2010-11-26 (×7): qty 1

## 2010-11-26 NOTE — Treatment Plan (Signed)
Patient has a UTI; urine culture on 11/22/10 showed  >100K ACINETOBACTER LWOFFII ENTEROCOCCUS SPECIES that is pansensitive.  Will treat with Amoxicillin 500 mg po tid x 7 days.   Patient informed of diagnosis and plan using Spanish interpreter.  Sumayah Bearse A 11/26/2010 9:40 AM

## 2010-11-26 NOTE — Progress Notes (Signed)
Post Partum Day 1 Subjective: up ad lib, tolerating PO and no flatus., moderate lochia, absent BM, absent flatus, plans to breastfeed, plans to bottle feed, implanon.  Pt complains of some dizziness with standing as well.   Objective: Blood pressure 119/75, pulse 89, temperature 98.1 F (36.7 C), temperature source Oral, resp. rate 18, height 4\' 10"  (1.473 m), weight 73.483 kg (162 lb), last menstrual period 01/18/2010, SpO2 100.00%.  Physical Exam:  General: alert and cooperative Lochia: appropriate Chest: CTAB Heart: RRR no m/r/g Abdomen: +BS, soft, nontender,  Uterine Fundus: firm At umbilicus DVT Evaluation: No evidence of DVT seen on physical exam. Extremities: no c/c/e   Basename 11/25/10 0052  HGB 12.2  HCT 36.3    Assessment/Plan: Plan for discharge tomorrow Will check orthostatic VS.  Encourage po and ambulation as tolerated.    LOS: 2 days   Lechelle Wrigley 11/26/2010, 8:52 AM

## 2010-11-27 DIAGNOSIS — M79609 Pain in unspecified limb: Secondary | ICD-10-CM

## 2010-11-27 MED ORDER — IBUPROFEN 600 MG PO TABS: 600.0000 mg | ORAL_TABLET | Freq: Four times a day (QID) | ORAL | Status: AC

## 2010-11-27 NOTE — Progress Notes (Signed)
Experienced breast feeding mom x 6 months with last infant who is 37 yrs old. Mother giving lots of bottles , inst to breastfeed first then give only 5-10 if she desires. Informed mother that infant didn't need any formula only mother milk. Lots of teaching and reviewed importance of making milk.

## 2010-11-27 NOTE — Discharge Summary (Signed)
Obstetric Discharge Summary Reason for Admission: onset of labor Prenatal Procedures: NST and ultrasound Intrapartum Procedures: spontaneous vaginal delivery Postpartum Procedures: none Complications-Operative and Postpartum: none  Hemoglobin  Date Value Range Status  11/25/2010 12.2  12.0-15.0 (g/dL) Final     HCT  Date Value Range Status  11/25/2010 36.3  36.0-46.0 (%) Final    Discharge Diagnoses: Term Pregnancy-delivered  Discharge Information: Date: 11/27/2010 Activity: pelvic rest Diet: routine Medications: Ibuprophen Condition: stable Instructions: refer to practice specific booklet Discharge to: home Follow-up Information    Follow up with [facility] [place] in 6 weeks.   Contact information:   [address] [facility] [place] [address]          Newborn Data: Live born  Information for the patient's newborn:  Sharyne Peach, Girl Solana [[id number]]  female Weight: 8#4 Apgars 6/9  Home with mother.  Garrette Caine E. 11/27/2010, 2:05 PM

## 2010-11-27 NOTE — Progress Notes (Signed)
Post Partum Day 2 Subjective: Complaints of left calf pain since last night, reports pain worse with walking  Objective: Blood pressure 108/73, pulse 73, temperature 98.2 F (36.8 C), temperature source Oral, resp. rate 18, height 4\' 10"  (1.473 m), weight 73.483 kg (162 lb), last menstrual period 01/18/2010, SpO2 100.00%.  Physical Exam:  General: alert, appears stated age and no distress Lochia: appropriate Uterine Fundus: firm DVT Evaluation: Positive Homan's sign. No redness or swelling noted   Basename 11/25/10 0052  HGB 12.2  HCT 36.3    Assessment/Plan: Breastfeeding and Contraception Implanon Will obtain LE Dopplers now, plan for d/c home if neg   LOS: 3 days   Toshiye Kever E. 11/27/2010, 8:33 AM

## 2010-11-28 NOTE — Progress Notes (Signed)
UR chart review completed.  

## 2010-12-09 ENCOUNTER — Encounter (HOSPITAL_COMMUNITY): Payer: Self-pay | Admitting: *Deleted

## 2010-12-16 DIAGNOSIS — F329 Major depressive disorder, single episode, unspecified: Secondary | ICD-10-CM

## 2010-12-30 ENCOUNTER — Ambulatory Visit: Payer: Self-pay | Admitting: Obstetrics & Gynecology

## 2012-09-25 ENCOUNTER — Ambulatory Visit: Payer: Self-pay

## 2012-10-04 ENCOUNTER — Ambulatory Visit: Payer: Medicaid Other | Attending: Family Medicine | Admitting: Internal Medicine

## 2012-10-04 VITALS — BP 108/75 | HR 71 | Temp 98.8°F | Resp 16 | Wt 128.0 lb

## 2012-10-04 DIAGNOSIS — R141 Gas pain: Secondary | ICD-10-CM | POA: Insufficient documentation

## 2012-10-04 DIAGNOSIS — R143 Flatulence: Secondary | ICD-10-CM

## 2012-10-04 DIAGNOSIS — R142 Eructation: Secondary | ICD-10-CM | POA: Insufficient documentation

## 2012-10-04 DIAGNOSIS — N750 Cyst of Bartholin's gland: Secondary | ICD-10-CM

## 2012-10-04 DIAGNOSIS — R140 Abdominal distension (gaseous): Secondary | ICD-10-CM | POA: Insufficient documentation

## 2012-10-04 HISTORY — DX: Cyst of Bartholin's gland: N75.0

## 2012-10-04 MED ORDER — FAMOTIDINE 20 MG PO TABS: 20.0000 mg | ORAL_TABLET | Freq: Two times a day (BID) | ORAL | Status: DC

## 2012-10-04 MED ORDER — ALUMINUM & MAGNESIUM HYDROXIDE 200-200 MG/5ML PO SUSP: 10.0000 mL | Freq: Four times a day (QID) | ORAL | Status: DC | PRN

## 2012-10-04 NOTE — Progress Notes (Signed)
Patient ID: Colleen Warner, female   DOB: 31-Mar-1976, 37 y.o.   MRN: [id number]  CC: Abdominal distention and labial cyst  HPI: 37 year old female with past medical history of urinary tract infection and depression who presented to the office for persistent abdominal pain and distention for about last month prior to this visit. Patient reported that whenever she eats her abdomen gets distended and she starts to experience abdominal discomfort. It does not matter which type of food she eats and it usually comes after 15-20 minutes after eating. She does have occasional nausea but no vomiting. No constipation or diarrhea and no blood in the stool. Patient also reports a boil in the labial, vaginal area for about a couple of months now and sometimes it gets smaller sometimes it gets bigger and it's not painful to touch. There was no fever or chills.  No Known Allergies Past Medical History  Diagnosis Date  . No pertinent past medical history   . Anemia   . UTI (urinary tract infection)   . Kidney stones   . Mental disorder     depression   Current Outpatient Prescriptions on File Prior to Visit  Medication Sig Dispense Refill  . prenatal vitamin w/FE, FA (PRENATAL 1 + 1) 27-1 MG TABS Take 1 tablet by mouth daily.         No current facility-administered medications on file prior to visit.   History reviewed. No pertinent family history. History   Social History  . Marital Status: Married    Spouse Name: N/A    Number of Children: N/A  . Years of Education: N/A   Occupational History  . Not on file.   Social History Main Topics  . Smoking status: Never Smoker   . Smokeless tobacco: Not on file  . Alcohol Use:      Comment: ETOH with pregnancy  . Drug Use: No  . Sexually Active: Yes   Other Topics Concern  . Not on file   Social History Narrative  . No narrative on file   Family medical history significant for HTN, HLD  Review of Systems  Constitutional: Negative  for fever, chills, diaphoresis, activity change, appetite change and fatigue.  HENT: Negative for ear pain, nosebleeds, congestion, facial swelling, rhinorrhea, neck pain, neck stiffness and ear discharge.   Eyes: Negative for pain, discharge, redness, itching and visual disturbance.  Respiratory: Negative for cough, choking, chest tightness, shortness of breath, wheezing and stridor.   Cardiovascular: Negative for chest pain, palpitations and leg swelling.  Gastrointestinal: Negative for abdominal distention.  Genitourinary: Negative for dysuria, urgency, frequency, hematuria, flank pain, decreased urine volume, difficulty urinating and dyspareunia.  Musculoskeletal: Negative for back pain, joint swelling, arthralgias and gait problem.  Neurological: Negative for dizziness, tremors, seizures, syncope, facial asymmetry, speech difficulty, weakness, light-headedness, numbness and headaches.  Hematological: Negative for adenopathy. Does not bruise/bleed easily.  Psychiatric/Behavioral: Negative for hallucinations, behavioral problems, confusion, dysphoric mood, decreased concentration and agitation.    Objective:   Filed Vitals:   10/04/12 1525  BP: 108/75  Pulse: 71  Temp: 98.8 F (37.1 C)  Resp: 16    Physical Exam  Constitutional: Appears well-developed and well-nourished. No distress.  HENT: Normocephalic. External right and left ear normal. Oropharynx is clear and moist.  Eyes: Conjunctivae and EOM are normal. PERRLA, no scleral icterus.  Neck: Normal ROM. Neck supple. No JVD. No tracheal deviation. No thyromegaly.  CVS: RRR, S1/S2 +, no murmurs, no gallops, no carotid bruit.  Pulmonary:  Effort and breath sounds normal, no stridor, rhonchi, wheezes, rales.  Abdominal: Soft. BS +,  no distension, tenderness, rebound or guarding.  Musculoskeletal: Normal range of motion. No edema and no tenderness.  Lymphadenopathy: No lymphadenopathy noted, cervical, inguinal. Neuro: Alert. Normal  reflexes, muscle tone coordination. No cranial nerve deficit. Skin: Skin is warm and dry. No rash noted. Not diaphoretic. No erythema. No pallor.  Psychiatric: Normal mood and affect. Behavior, judgment, thought content normal.   Lab Results  Component Value Date   WBC 9.0 11/25/2010   HGB 12.2 11/25/2010   HCT 36.3 11/25/2010   MCV 91.0 11/25/2010   PLT 161 11/25/2010   Lab Results  Component Value Date   CREATININE <0.47 REPEATED TO VERIFY 09/19/2010   BUN 6 09/19/2010   NA 135 09/19/2010   K 3.4* 09/19/2010   CL 96 09/19/2010   CO2 22 09/19/2010    No results found for this basename: HGBA1C   Lipid Panel  No results found for this basename: chol, trig, hdl, cholhdl, vldl, ldlcalc       Assessment and plan:   Patient Active Problem List   Diagnosis Date Noted  . Abdominal distention 10/04/2012    Priority: High - Unclear Etiology. We will provide prescription for Pepcid 20 mg twice daily as well as Maalox for possible indigestion. Patient was given a referral to GI   . Bartholin cyst 10/04/2012    Priority: High - Referral provided for gynecology  - Use warm compresses as needed

## 2012-10-04 NOTE — Patient Instructions (Signed)
Absceso o quiste de Bartolino  (Bartholin's Cyst or Abscess)  Las glándulas de Bartolino son glándulas pequeñas ubicadas dentro de los pliegues de la piel (labios) a los lados de la apertura de la vagina (canal del parto). Cuando el conducto de la glándula se obstruye, puede desarrollarse un quiste. Cuando esto ocurre, el líquido que se acumula dentro del quiste puede llegar a infectarse. Esto se conoce como absceso. La glándula de Bartolino produce una mucosidad líquida en la parte externa de la vagina durante las relaciones sexuales.  SÍNTOMAS  · Los pacientes que presentan un quiste pequeño no tienen problemas.  · Podrá sentir desde una leve molestia a un dolor intenso según el tamaño del quiste y si existe infección o no,  · Sentirá dolor, inflamación e hinchazón en la zona inferior de la vagina.  · Dolor en las relaciones sexuales.  · Presión en las zona del perineo.  · Hinchazón de los labios de la vagina.  · El quiste puede estar en uno o ambos lados de la vagina.  DIAGNÓSTICO  · El profesional podrá observar una gran zona hinchada en la parte inferior de la vagina.  · Es una zona dolorosa al tacto.  · Si se trata de un absceso habrá inflamación y dolor.  TRATAMIENTO  · En algunos casos el quiste desaparecerá sin tratamiento.  · Aplique compresas tibias húmedas en la zona o tome baños de asiento varias veces al día.  · Le practicarán una incisión para drenar el quiste o el absceso, previa aplicación de anestesia local.  · Si se trata de un absceso le indicarán un cultivo del pus.  · Y en ese caso le prescribirán un tratamiento con antibióticos.  · Se realizará una abertura en la glándula, suturando los bordes para hacer la abertura más grande (marsupialización).  · Si aparece nuevamente el quiste o el absceso, le extirparán toda la glándula.  PREVENCIÓN  · Mantenga una buena higiene.  · Higienice la zona vaginal con jabón neutro y un paño suave.  · No frote la zona al darse un baño.  · Proteja la zona de la  entrepierna con un apósito si realiza largos paseos en bicicleta o a caballo.  · Asegúrese de estar bien lubricada cuando mantenga relaciones sexuales.  INSTRUCCIONES PARA EL CUIDADO DOMICILIARIO  · Si su quiste o absceso ha sido abierto, pudieran haberle colocado un pequeño trozo de gasa o un drenaje para permitir que la herida supure. La gasa o el drenaje a menos que se lo indique el profesional que le asiste.  · Use toallas femeninas y no tampones cuando lo necesite en caso de drenaje o sangrado.  · Si le han recetado medicamentos que combaten los gérmenes (antibióticos ), tómelos exactamente de la manera que le haya sido indicada. Asegúrese de terminar con todo el ciclo de antibióticos.  · Utilice los medicamentos de venta libre o de prescripción para el dolor, el malestar o la fiebre, según se lo indique el profesional que lo asiste.  SOLICITE ATENCIÓN MÉDICA DE INMEDIATO SI:  · Aumenta el dolor, el enrojecimiento, la hinchazón o la supuración.  · La herida ha sangrado al punto que ha debido usar más apósitos de los que la cantidad de apósitos sugerida por el médico en 24 horas.  · Siente escalofríos.  · Tiene fiebre.  · Tiene algún problema (síntoma) nuevo o se agravan lo ya existentes.  ESTÉ SEGURO QUE:  · Comprende las instrucciones para el alta médica.  · Controlará su   enfermedad.  · Solicitará atención médica de inmediato según las indicaciones.  Document Released: 04/17/2005 Document Revised: 07/10/2011  [employer]® Patient Information ©2014 [employer].

## 2012-10-04 NOTE — Progress Notes (Signed)
Patient here to establish care Has some  Left side abd pain Has some  Bumps in the vaginal area When pregnant was told she had kidney stones

## 2012-10-22 ENCOUNTER — Encounter: Payer: Self-pay | Admitting: Obstetrics and Gynecology

## 2012-11-04 ENCOUNTER — Ambulatory Visit: Payer: Self-pay

## 2012-11-25 ENCOUNTER — Encounter: Payer: Self-pay | Admitting: Obstetrics and Gynecology

## 2012-12-19 ENCOUNTER — Ambulatory Visit (INDEPENDENT_AMBULATORY_CARE_PROVIDER_SITE_OTHER): Payer: No Typology Code available for payment source | Admitting: Obstetrics & Gynecology

## 2012-12-19 ENCOUNTER — Encounter: Payer: Self-pay | Admitting: Obstetrics & Gynecology

## 2012-12-19 VITALS — BP 120/78 | HR 76 | Temp 97.8°F | Ht <= 58 in | Wt 130.8 lb

## 2012-12-19 DIAGNOSIS — Z711 Person with feared health complaint in whom no diagnosis is made: Secondary | ICD-10-CM

## 2012-12-19 DIAGNOSIS — IMO0001 Reserved for inherently not codable concepts without codable children: Secondary | ICD-10-CM

## 2012-12-19 NOTE — Progress Notes (Signed)
  Subjective:    Patient ID: Colleen Warner, female    DOB: 08-23-1975, 37 y.o.   MRN: [id number]  HPI  37 yo P3 who is here today because she questions whether her vagina is normal. When she stuck her finger in her vagina and elevated her finger, she felt a bulge and wonders if this is normal. She also complains of some hemorrhoid pain but has not tried any OTC creams.   Review of Systems [facility]    Objective:   Physical Exam  Entirely normal shaved vulva and vagina. I believe that she is feeling her bladder when she is having her finger in her vagina. I have reassured her that she is normal.      Assessment & Plan:  Hemorrhoids- rec OTC relief

## 2012-12-23 DIAGNOSIS — R140 Abdominal distension (gaseous): Secondary | ICD-10-CM | POA: Insufficient documentation

## 2012-12-23 HISTORY — DX: Abdominal distension (gaseous): R14.0

## 2013-03-26 ENCOUNTER — Ambulatory Visit: Payer: Self-pay

## 2013-03-26 ENCOUNTER — Ambulatory Visit: Payer: No Typology Code available for payment source | Attending: Internal Medicine

## 2013-04-03 ENCOUNTER — Emergency Department (HOSPITAL_COMMUNITY)
Admission: EM | Admit: 2013-04-03 | Discharge: 2013-04-03 | Disposition: A | Discharge status: Home / Self Care | Payer: Self-pay | Attending: Emergency Medicine | Admitting: Emergency Medicine

## 2013-04-03 ENCOUNTER — Emergency Department (HOSPITAL_COMMUNITY): Payer: Self-pay

## 2013-04-03 ENCOUNTER — Encounter (HOSPITAL_COMMUNITY): Payer: Self-pay | Admitting: Emergency Medicine

## 2013-04-03 DIAGNOSIS — K8020 Calculus of gallbladder without cholecystitis without obstruction: Secondary | ICD-10-CM | POA: Insufficient documentation

## 2013-04-03 DIAGNOSIS — Z8659 Personal history of other mental and behavioral disorders: Secondary | ICD-10-CM | POA: Insufficient documentation

## 2013-04-03 DIAGNOSIS — N201 Calculus of ureter: Secondary | ICD-10-CM | POA: Insufficient documentation

## 2013-04-03 DIAGNOSIS — Z87440 Personal history of urinary (tract) infections: Secondary | ICD-10-CM | POA: Insufficient documentation

## 2013-04-03 DIAGNOSIS — N200 Calculus of kidney: Secondary | ICD-10-CM | POA: Insufficient documentation

## 2013-04-03 DIAGNOSIS — Z79899 Other long term (current) drug therapy: Secondary | ICD-10-CM | POA: Insufficient documentation

## 2013-04-03 DIAGNOSIS — Z862 Personal history of diseases of the blood and blood-forming organs and certain disorders involving the immune mechanism: Secondary | ICD-10-CM | POA: Insufficient documentation

## 2013-04-03 DIAGNOSIS — Z3202 Encounter for pregnancy test, result negative: Secondary | ICD-10-CM | POA: Insufficient documentation

## 2013-04-03 LAB — COMPREHENSIVE METABOLIC PANEL
Albumin: 3.4 g/dL — ABNORMAL LOW (ref 3.5–5.2)
Alkaline Phosphatase: 62 U/L (ref 39–117)
BUN: 11 mg/dL (ref 6–23)
CO2: 22 mEq/L (ref 19–32)
Chloride: 104 mEq/L (ref 96–112)
Creatinine, Ser: 0.69 mg/dL (ref 0.50–1.10)
GFR calc non Af Amer: >90 mL/min (ref >90)
Glucose, Bld: 89 mg/dL (ref 70–99)
Potassium: 3.7 mEq/L (ref 3.5–5.1)
Total Bilirubin: 0.2 mg/dL — ABNORMAL LOW (ref 0.3–1.2)

## 2013-04-03 LAB — CBC WITH DIFFERENTIAL/PLATELET
Basophils Relative: 0 % (ref 0–1)
HCT: 37 % (ref 36.0–46.0)
Hemoglobin: 12.6 g/dL (ref 12.0–15.0)
Lymphocytes Relative: 29 % (ref 12–46)
Lymphs Abs: 2.4 10*3/uL (ref 0.7–4.0)
Monocytes Absolute: 0.5 10*3/uL (ref 0.1–1.0)
Monocytes Relative: 6 % (ref 3–12)
Neutro Abs: 5.4 10*3/uL (ref 1.7–7.7)
Neutrophils Relative %: 64 % (ref 43–77)
RBC: 4.27 MIL/uL (ref 3.87–5.11)
WBC: 8.4 10*3/uL (ref 4.0–10.5)

## 2013-04-03 LAB — URINALYSIS, ROUTINE W REFLEX MICROSCOPIC
Glucose, UA: NEGATIVE mg/dL
Ketones, ur: NEGATIVE mg/dL
Protein, ur: 100 mg/dL — AB
Urobilinogen, UA: 0.2 mg/dL (ref 0.0–1.0)

## 2013-04-03 LAB — URINE MICROSCOPIC-ADD ON

## 2013-04-03 MED ORDER — HYDROMORPHONE HCL PF 1 MG/ML IJ SOLN
1.0000 mg | Freq: Once | INTRAMUSCULAR | Status: AC
  Administered 2013-04-03: 1 mg via INTRAVENOUS
  Filled 2013-04-03: qty 1

## 2013-04-03 MED ORDER — METOCLOPRAMIDE HCL 10 MG PO TABS: 10.0000 mg | ORAL_TABLET | Freq: Four times a day (QID) | ORAL | Status: DC | PRN

## 2013-04-03 MED ORDER — ONDANSETRON HCL 4 MG/2ML IJ SOLN
4.0000 mg | Freq: Once | INTRAMUSCULAR | Status: AC
  Administered 2013-04-03: 4 mg via INTRAVENOUS
  Filled 2013-04-03: qty 2

## 2013-04-03 MED ORDER — TAMSULOSIN HCL 0.4 MG PO CAPS: 0.4000 mg | ORAL_CAPSULE | Freq: Every day | ORAL | Status: DC

## 2013-04-03 MED ORDER — OXYCODONE-ACETAMINOPHEN 5-325 MG PO TABS: 1.0000 | ORAL_TABLET | ORAL | Status: DC | PRN

## 2013-04-03 MED ORDER — SODIUM CHLORIDE 0.9 % IV SOLN: 1000.0000 mL | INTRAVENOUS | Status: DC

## 2013-04-03 MED ORDER — SODIUM CHLORIDE 0.9 % IV SOLN
1000.0000 mL | Freq: Once | INTRAVENOUS | Status: AC
  Administered 2013-04-03: 1000 mL via INTRAVENOUS

## 2013-04-03 MED ORDER — KETOROLAC TROMETHAMINE 30 MG/ML IJ SOLN
30.0000 mg | Freq: Once | INTRAMUSCULAR | Status: AC
  Administered 2013-04-03: 30 mg via INTRAVENOUS
  Filled 2013-04-03: qty 1

## 2013-04-03 NOTE — ED Notes (Signed)
Pt reports right sided flank pain , radiating to the right lower quadrant. Pt reports this pain started around 0200 this AM.

## 2013-04-03 NOTE — ED Notes (Signed)
Glick, MD at bedside.  

## 2013-04-03 NOTE — ED Provider Notes (Signed)
CSN: [id number]     Arrival date & time 04/03/13  0434 History   First MD Initiated Contact with Patient 04/03/13 [phone]     Chief Complaint  Patient presents with  . Flank Pain   (Consider location/radiation/quality/duration/timing/severity/associated sxs/prior Treatment) Patient is a 37 y.o. female presenting with flank pain. The history is provided by the patient.  Flank Pain  She was awakened at 2 AM with severe pain in the right flank radiating to the right lower abdomen. She rates pain at 9/10. Nothing makes it better nothing makes it worse. There is associated nausea without vomiting. She denies fever, chills, sweats. She denies urinary urgency, frequency, tenesmus, dysuria. She's not had pain like this before. She did not take anything at home to treat it. She did have an ultrasound 3 years ago which showed kidney stones.  Past Medical History  Diagnosis Date  . No pertinent past medical history   . Anemia   . UTI (urinary tract infection)   . Kidney stones   . Mental disorder     depression   Past Surgical History  Procedure Laterality Date  . No past surgeries     History reviewed. No pertinent family history. History  Substance Use Topics  . Smoking status: Never Smoker   . Smokeless tobacco: Not on file  . Alcohol Use:      Comment: ETOH with pregnancy   OB History   Grav Para Term Preterm Abortions TAB SAB Ect Mult Living   3 3 3       3      Review of Systems  Genitourinary: Positive for flank pain.  All other systems reviewed and are negative.    Allergies  Review of patient's allergies indicates no known allergies.  Home Medications   Current Outpatient Rx  Name  Route  Sig  Dispense  Refill  . Norethindrone-Eth Estradiol (BREVICON, 28, PO)   Oral   Take 1 tablet by mouth daily.          BP 131/75  Pulse 74  Temp(Src) 98.1 F (36.7 C) (Oral)  Resp 22  SpO2 100%  LMP 03/25/2013 Physical Exam  Nursing note and vitals reviewed.  37 year  old female, who appears restless and uncomfortable, but is in no acute distress. Vital signs are significant for mild tachypnea with respiratory rate of 22. Oxygen saturation is 100%, which is normal. Head is normocephalic and atraumatic. PERRLA, EOMI. Oropharynx is clear. Neck is nontender and supple without adenopathy or JVD. Back is nontender in the midline. There is moderate right CVA tenderness. Lungs are clear without rales, wheezes, or rhonchi. Chest is nontender. Heart has regular rate and rhythm without murmur. Abdomen is soft, flat, with mild to moderate tenderness in the right lower abdomen. There is no rebound or guarding. There are no masses or hepatosplenomegaly and peristalsis is hypoactive. Extremities have no cyanosis or edema, full range of motion is present. Skin is warm and dry without rash. Neurologic: Mental status is normal, cranial nerves are intact, there are no motor or sensory deficits.  ED Course  Procedures (including critical care time) Labs Review Results for orders placed during the hospital encounter of 04/03/13  CBC WITH DIFFERENTIAL      Result Value Range   WBC 8.4  4.0 - 10.5 K/uL   RBC 4.27  3.87 - 5.11 MIL/uL   Hemoglobin 12.6  12.0 - 15.0 g/dL   HCT 11.9  14.7 - 82.9 %   MCV 86.7  78.0 - 100.0 fL   MCH 29.5  26.0 - 34.0 pg   MCHC 34.1  30.0 - 36.0 g/dL   RDW 16.1  09.6 - 04.5 %   Platelets 217  150 - 400 K/uL   Neutrophils Relative % 64  43 - 77 %   Neutro Abs 5.4  1.7 - 7.7 K/uL   Lymphocytes Relative 29  12 - 46 %   Lymphs Abs 2.4  0.7 - 4.0 K/uL   Monocytes Relative 6  3 - 12 %   Monocytes Absolute 0.5  0.1 - 1.0 K/uL   Eosinophils Relative 1  0 - 5 %   Eosinophils Absolute 0.1  0.0 - 0.7 K/uL   Basophils Relative 0  0 - 1 %   Basophils Absolute 0.0  0.0 - 0.1 K/uL  COMPREHENSIVE METABOLIC PANEL      Result Value Range   Sodium 138  135 - 145 mEq/L   Potassium 3.7  3.5 - 5.1 mEq/L   Chloride 104  96 - 112 mEq/L   CO2 22  19 - 32  mEq/L   Glucose, Bld 89  70 - 99 mg/dL   BUN 11  6 - 23 mg/dL   Creatinine, Ser 4.09  0.50 - 1.10 mg/dL   Calcium 9.0  8.4 - 81.1 mg/dL   Total Protein 6.8  6.0 - 8.3 g/dL   Albumin 3.4 (*) 3.5 - 5.2 g/dL   AST 15  0 - 37 U/L   ALT 9  0 - 35 U/L   Alkaline Phosphatase 62  39 - 117 U/L   Total Bilirubin 0.2 (*) 0.3 - 1.2 mg/dL   GFR calc non Af Amer >90  >90 mL/min   GFR calc Af Amer >90  >90 mL/min  URINALYSIS, ROUTINE W REFLEX MICROSCOPIC      Result Value Range   Color, Urine YELLOW  YELLOW   APPearance TURBID (*) CLEAR   Specific Gravity, Urine 1.022  1.005 - 1.030   pH 5.5  5.0 - 8.0   Glucose, UA NEGATIVE  NEGATIVE mg/dL   Hgb urine dipstick LARGE (*) NEGATIVE   Bilirubin Urine NEGATIVE  NEGATIVE   Ketones, ur NEGATIVE  NEGATIVE mg/dL   Protein, ur 914 (*) NEGATIVE mg/dL   Urobilinogen, UA 0.2  0.0 - 1.0 mg/dL   Nitrite NEGATIVE  NEGATIVE   Leukocytes, UA SMALL (*) NEGATIVE  URINE MICROSCOPIC-ADD ON      Result Value Range   Squamous Epithelial / LPF MANY (*) RARE   WBC, UA 3-6  <3 WBC/hpf   RBC / HPF TOO NUMEROUS TO COUNT  <3 RBC/hpf   Bacteria, UA FEW (*) RARE  POCT PREGNANCY, URINE      Result Value Range   Preg Test, Ur NEGATIVE  NEGATIVE   Imaging Review Ct Abdomen Pelvis Wo Contrast  04/03/2013   CLINICAL DATA:  Right flank pain  EXAM: CT ABDOMEN AND PELVIS WITHOUT CONTRAST  TECHNIQUE: Multidetector CT imaging of the abdomen and pelvis was performed following the standard protocol without intravenous contrast.  COMPARISON:  None.  FINDINGS: Lung bases clear. Normal heart size. Trace pericardial fluid versus thickening.  Intra-abdominal organ evaluation is limited in the absence of intravenous contrast. Within this limitation, no focal abnormality appreciated involving the liver, spleen, pancreas, adrenal glands.  Numerous gallstones. No pericholecystic fat stranding or fluid. No biliary ductal dilatation.  Bilateral nonobstructing renal stones. Right renal edema and  moderate hydronephrosis to the level of a  6 mm UPJ stone. No hydroureter.  No CT evidence for colitis. Normal appendix. Small bowel loops are of normal course and caliber. No free intraperitoneal air or fluid. No lymphadenopathy. Normal caliber aorta and branch vessels.  Nonspecific appearance to the uterus. 3.7 cm left adnexal cyst is most likely benign/physiologic. Partially decompressed bladder.  Well corticated rounded density within the left posterior acetabular wall may reflect a synovial herniation pit or bone island. Overall, the appearance is nonaggressive. No acute osseous finding.  IMPRESSION: Moderate right hydronephrosis to the level of a 6 mm UPJ stone.  Additional bilateral nonobstructing renal stones.  Gallstones.  3.7 cm left adnexal cyst is likely a benign/physiologic ovarian cyst. Consider a 6 week pelvic ultrasound followup to document resolution.   Electronically Signed   By: Jearld Lesch M.D.   On: 04/03/2013 06:02   Images viewed by me.  MDM   1. Ureterolithiasis   2. Cholelithiasis   3. Nephrolithiasis    Right ureterolithiasis. Old records are reviewed and she had an ultrasound on 08/29/2010 which showed 2 right renal calculi. She will be sent for CT to further evaluate the this and that she is given IV fluids, IV hydromorphone, IV ketorolac, and IV ondansetron.  She gets good relief with the above-noted treatment. CT shows a 6 mm stone at the right ureteropelvic junction. Incidentally noted are additional right renal calculus and 3 left renal calculi as well as cholelithiasis. She is discharged with prescriptions for oxycodone-acetaminophen, metoclopramide, and tamsulosin urine she is referred to urology for followup. She is advised that stone of this size is likely to need additional treatment such as lithotripsy.  Dione Booze, MD 04/03/13 [phone]

## 2013-04-04 LAB — URINE CULTURE: Colony Count: >=100000

## 2013-04-05 ENCOUNTER — Telehealth (HOSPITAL_COMMUNITY): Payer: Self-pay | Admitting: Emergency Medicine

## 2013-04-05 NOTE — Progress Notes (Signed)
ED Antimicrobial Stewardship Positive Culture Follow Up   Colleen Warner is an 37 y.o. female who presented to [facility] on 04/03/2013 with a chief complaint of flank pain  Chief Complaint  Patient presents with  . Flank Pain    Recent Results (from the past 720 hour(s))  URINE CULTURE     Status: None   Collection Time    04/03/13  4:51 AM      Result Value Range Status   Specimen Description URINE, CLEAN CATCH   Final   Special Requests CX ADDED AT 0533 ON [id number]   Final   Culture  Setup Time     Final   Value: 04/03/2013 05:39     Performed at [facility]   Colony Count     Final   Value: >=100,000 COLONIES/ML     Performed at [facility]   Culture     Final   Value: GROUP B STREP(S.AGALACTIAE)ISOLATED     Note: TESTING AGAINST S. AGALACTIAE NOT ROUTINELY PERFORMED DUE TO PREDICTABILITY OF AMP/PEN/VAN SUSCEPTIBILITY.     Performed at [facility]   Report Status 04/04/2013 FINAL   Final    [x]  No treatment indicated  37 YOF admitted with R-sided flank pain that radiated to the LLQ. Upon imaging, the patient was found to have a 6 mm kidney stone, renal calculi, and cholelithiasis. The patient was d/ced home with pain meds and is to f/u with urology for possible lithiotripsy. The patient was also found to grow group B strept in the urine culture which is more reflective of colonization.  New antibiotic prescription: No treatment indicated  ED Provider: Antony Madura, PA-C  Rolley Sims 04/05/2013, 9:39 AM Infectious Diseases Pharmacist Phone# [phone]

## 2013-04-05 NOTE — ED Notes (Signed)
Post ED Visit - Positive Culture Follow-up  Culture report reviewed by antimicrobial stewardship pharmacist: []  Wes Dulaney, Pharm.D., BCPS []  Celedonio Miyamoto, [address].D., BCPS [x]  Georgina Pillion, Pharm.D., BCPS []  [place], [address].D., BCPS, AAHIVP []  Estella Husk, Pharm.D., BCPS, AAHIVP  Positive urine culture Per Antony Madura PA-C, no treatment needed and no further patient follow-up is required at this time.  Kylie A Holland 04/05/2013, 5:02 PM

## 2013-04-08 ENCOUNTER — Encounter: Payer: Self-pay | Admitting: Internal Medicine

## 2013-04-08 ENCOUNTER — Ambulatory Visit: Payer: Medicaid Other | Attending: Internal Medicine | Admitting: Internal Medicine

## 2013-04-08 VITALS — BP 109/75 | HR 70 | Temp 98.6°F | Resp 18 | Ht 59.0 in | Wt 132.0 lb

## 2013-04-08 DIAGNOSIS — N390 Urinary tract infection, site not specified: Secondary | ICD-10-CM | POA: Insufficient documentation

## 2013-04-08 DIAGNOSIS — K8020 Calculus of gallbladder without cholecystitis without obstruction: Secondary | ICD-10-CM

## 2013-04-08 DIAGNOSIS — N200 Calculus of kidney: Secondary | ICD-10-CM | POA: Insufficient documentation

## 2013-04-08 MED ORDER — AMOXICILLIN 500 MG PO CAPS: 500.0000 mg | ORAL_CAPSULE | Freq: Three times a day (TID) | ORAL | Status: AC

## 2013-04-08 NOTE — Progress Notes (Signed)
Pt here f/u gallstones and medical management Pt was seen in ER Thursday and prescribed Flomax,Oxycodone Need referral dentist/general surgery Spanish interpretor present

## 2013-04-08 NOTE — Progress Notes (Signed)
Patient ID: Colleen Warner, female   DOB: 11/05/1975, 37 y.o.   MRN: [id number]   CC:  HPI: 37 year old female who is here for a followup. Patient was recently seen in the ED on 12/4. She had a CT scan that showed bilateral nonobstructing renal stones. A moderate right hydronephrosis at the level of the 6 mm UPJ stone. She was also found to have numerous gallstones She was prescribed metoclopramide, Flomax, Percocet by the ER  Today her pain is well controlled. She denies any dysuria urgency frequency fever but occasionally has chills She had a urine culture that was positive for group B strep greater than 100,000 colonies  She states that she saw gastroenterology in [facility] 2 months ago. She was told that she has H. Pylori  via blood test. She also had an endoscopy that was within normal limits  She occasionally describes nausea when she eats for about a month. She denies any episodes of vomiting.  No Known Allergies Past Medical History  Diagnosis Date  . No pertinent past medical history   . Anemia   . UTI (urinary tract infection)   . Kidney stones   . Mental disorder     depression   Current Outpatient Prescriptions on File Prior to Visit  Medication Sig Dispense Refill  . metoCLOPramide (REGLAN) 10 MG tablet Take 1 tablet (10 mg total) by mouth every 6 (six) hours as needed for nausea.  30 tablet  0  . oxyCODONE-acetaminophen (PERCOCET) 5-325 MG per tablet Take 1 tablet by mouth every 4 (four) hours as needed.  20 tablet  0  . tamsulosin (FLOMAX) 0.4 MG CAPS capsule Take 1 capsule (0.4 mg total) by mouth daily.  5 capsule  0  . Norethindrone-Eth Estradiol (BREVICON, 28, PO) Take 1 tablet by mouth daily.       No current facility-administered medications on file prior to visit.   History reviewed. No pertinent family history. History   Social History  . Marital Status: Married    Spouse Name: N/A    Number of Children: N/A  . Years of Education: N/A    Occupational History  . Not on file.   Social History Main Topics  . Smoking status: Never Smoker   . Smokeless tobacco: Not on file  . Alcohol Use:      Comment: ETOH with pregnancy  . Drug Use: No  . Sexual Activity: Yes   Other Topics Concern  . Not on file   Social History Narrative  . No narrative on file    Review of Systems  Constitutional: Negative for fever, chills, diaphoresis, activity change, appetite change and fatigue.  HENT: Negative for ear pain, nosebleeds, congestion, facial swelling, rhinorrhea, neck pain, neck stiffness and ear discharge.   Eyes: Negative for pain, discharge, redness, itching and visual disturbance.  Respiratory: Negative for cough, choking, chest tightness, shortness of breath, wheezing and stridor.   Cardiovascular: Negative for chest pain, palpitations and leg swelling.  Gastrointestinal: Negative for abdominal distention.  Genitourinary: Negative for dysuria, urgency, frequency, hematuria, flank pain, decreased urine volume, difficulty urinating and dyspareunia.  Musculoskeletal: Negative for back pain, joint swelling, arthralgias and gait problem.  Neurological: Negative for dizziness, tremors, seizures, syncope, facial asymmetry, speech difficulty, weakness, light-headedness, numbness and headaches.  Hematological: Negative for adenopathy. Does not bruise/bleed easily.  Psychiatric/Behavioral: Negative for hallucinations, behavioral problems, confusion, dysphoric mood, decreased concentration and agitation.    Objective:   Filed Vitals:   04/08/13 1651  BP: 109/75  Pulse: 70  Temp: 98.6 F (37 C)  Resp: 18    Physical Exam  Constitutional: Appears well-developed and well-nourished. No distress.  HENT: Normocephalic. External right and left ear normal. Oropharynx is clear and moist.  Eyes: Conjunctivae and EOM are normal. PERRLA, no scleral icterus.  Neck: Normal ROM. Neck supple. No JVD. No tracheal deviation. No  thyromegaly.  CVS: RRR, S1/S2 +, no murmurs, no gallops, no carotid bruit.  Pulmonary: Effort and breath sounds normal, no stridor, rhonchi, wheezes, rales.  Abdominal: Soft. BS +,  no distension, tenderness, rebound or guarding.  Musculoskeletal: Normal range of motion. No edema and no tenderness.  Lymphadenopathy: No lymphadenopathy noted, cervical, inguinal. Neuro: Alert. Normal reflexes, muscle tone coordination. No cranial nerve deficit. Skin: Skin is warm and dry. No rash noted. Not diaphoretic. No erythema. No pallor.  Psychiatric: Normal mood and affect. Behavior, judgment, thought content normal.   Lab Results  Component Value Date   WBC 8.4 04/03/2013   HGB 12.6 04/03/2013   HCT 37.0 04/03/2013   MCV 86.7 04/03/2013   PLT 217 04/03/2013   Lab Results  Component Value Date   CREATININE 0.69 04/03/2013   BUN 11 04/03/2013   NA 138 04/03/2013   K 3.7 04/03/2013   CL 104 04/03/2013   CO2 22 04/03/2013    No results found for this basename: HGBA1C   Lipid Panel  No results found for this basename: chol, trig, hdl, cholhdl, vldl, ldlcalc       Assessment and plan:   Patient Active Problem List   Diagnosis Date Noted  . Abdominal distention 10/04/2012  . Bartholin cyst 10/04/2012  . Depression 12/16/2010  . UTI (urinary tract infection)        Nephrolithiasis with mild hydronephrosis Urology referral has been provided Continue Flomax   Urinary tract infection Prescribe amoxicillin for 7 days   Cholelithiasis Gen. surgery referral has been provided   The patient was given clear instructions to go to ER or return to medical center if symptoms don't improve, worsen or new problems develop. The patient verbalized understanding. The patient was told to call to get any lab results if not heard anything in the next week.

## 2013-04-10 ENCOUNTER — Ambulatory Visit: Payer: Self-pay

## 2013-04-21 DIAGNOSIS — R109 Unspecified abdominal pain: Secondary | ICD-10-CM

## 2013-04-21 HISTORY — DX: Unspecified abdominal pain: R10.9

## 2013-05-01 HISTORY — PX: KIDNEY STONE SURGERY: SHX686

## 2013-05-13 ENCOUNTER — Ambulatory Visit: Payer: Self-pay | Admitting: Internal Medicine

## 2013-05-21 ENCOUNTER — Ambulatory Visit: Payer: Self-pay

## 2013-07-16 ENCOUNTER — Other Ambulatory Visit: Payer: Self-pay

## 2013-07-16 ENCOUNTER — Ambulatory Visit
Admission: RE | Admit: 2013-07-16 | Discharge: 2013-07-16 | Disposition: A | Discharge status: Home / Self Care | Payer: No Typology Code available for payment source | Source: Ambulatory Visit

## 2013-07-16 DIAGNOSIS — Z1231 Encounter for screening mammogram for malignant neoplasm of breast: Secondary | ICD-10-CM

## 2013-07-28 ENCOUNTER — Telehealth: Payer: Self-pay | Admitting: Emergency Medicine

## 2013-07-28 NOTE — Telephone Encounter (Signed)
Message copied by Darlis LoanSMITH, Revia Nghiem D on Mon Jul 28, 2013  1:30 PM ------      Message from: Susie CassetteABROL MD, Laser Surgery Holding Company LtdNAYANA      Created: Wed Jul 23, 2013 11:51 AM       Notify patient of the mammogram is negative ------

## 2013-07-28 NOTE — Telephone Encounter (Signed)
Message copied by Darlis LoanSMITH, Zharia Conrow D on Mon Jul 28, 2013  3:28 PM ------      Message from: Susie CassetteABROL MD, Germain OsgoodNAYANA      Created: Sat Jul 19, 2013 12:10 PM       Notify patient that mammogram was negative ------

## 2013-11-11 ENCOUNTER — Ambulatory Visit: Payer: Self-pay | Attending: Internal Medicine

## 2014-02-02 ENCOUNTER — Ambulatory Visit (HOSPITAL_COMMUNITY): Payer: No Typology Code available for payment source | Attending: Family Medicine

## 2014-02-02 ENCOUNTER — Encounter (HOSPITAL_COMMUNITY): Payer: Self-pay | Admitting: Emergency Medicine

## 2014-02-02 ENCOUNTER — Emergency Department (INDEPENDENT_AMBULATORY_CARE_PROVIDER_SITE_OTHER)
Admission: EM | Admit: 2014-02-02 | Discharge: 2014-02-02 | Disposition: A | Discharge status: Home / Self Care | Payer: No Typology Code available for payment source | Source: Home / Self Care | Attending: Family Medicine | Admitting: Family Medicine

## 2014-02-02 DIAGNOSIS — R0602 Shortness of breath: Secondary | ICD-10-CM | POA: Insufficient documentation

## 2014-02-02 DIAGNOSIS — R079 Chest pain, unspecified: Secondary | ICD-10-CM | POA: Insufficient documentation

## 2014-02-02 DIAGNOSIS — M898X1 Other specified disorders of bone, shoulder: Secondary | ICD-10-CM

## 2014-02-02 MED ORDER — DICLOFENAC SODIUM 50 MG PO TBEC: 50.0000 mg | DELAYED_RELEASE_TABLET | Freq: Two times a day (BID) | ORAL | Status: DC | PRN

## 2014-02-02 MED ORDER — CYCLOBENZAPRINE HCL 5 MG PO TABS: 5.0000 mg | ORAL_TABLET | Freq: Every evening | ORAL | Status: DC | PRN

## 2014-02-02 NOTE — ED Provider Notes (Signed)
Colleen Warner is a 38 y.o. female who presents to Urgent Care today for left chest and back pain. Symptoms started today at about 2 PM. The pain is located at the left scapula and is associated with arm motion. No shortness of breath or radiating pain. No exertional pain. Pain is sharp. Pain is much more intense earlier today in improved this afternoon. She's not tried any medications yet.   Past Medical History  Diagnosis Date  . No pertinent past medical history   . Anemia   . UTI (urinary tract infection)   . Kidney stones   . Mental disorder     depression   History  Substance Use Topics  . Smoking status: Never Smoker   . Smokeless tobacco: Not on file  . Alcohol Use:      Comment: ETOH with pregnancy   ROS as above Medications: No current facility-administered medications for this encounter.   Current Outpatient Prescriptions  Medication Sig Dispense Refill  . cyclobenzaprine (FLEXERIL) 5 MG tablet Take 1 tablet (5 mg total) by mouth at bedtime as needed for muscle spasms. spanish  20 tablet  0  . diclofenac (VOLTAREN) 50 MG EC tablet Take 1 tablet (50 mg total) by mouth 2 (two) times daily as needed. spanish  30 tablet  0  . Norethindrone-Eth Estradiol (BREVICON, 28, PO) Take 1 tablet by mouth daily.      . [DISCONTINUED] metoCLOPramide (REGLAN) 10 MG tablet Take 1 tablet (10 mg total) by mouth every 6 (six) hours as needed for nausea.  30 tablet  0    Exam:  BP 110/71  Pulse 70  Temp(Src) 98.1 F (36.7 C) (Oral)  Resp 14  SpO2 100%  LMP 01/01/2014 Gen: Well NAD HEENT: EOMI,  MMM Lungs: Normal work of breathing. CTABL Heart: RRR no MRG Abd: NABS, Soft. Nondistended, Nontender Exts: Brisk capillary refill, warm and well perfused.   MSK: Normal-appearing shoulder and scapula. Tender palpation left. Tablet muscles. Pain with shoulder motion and scapular motion. Negative shoulder impingement testing. Shoulder strength is intact. Pulses capillary refill  sensation are intact.  Twelve-lead EKG shows sinus bradycardia at 54 beats per minute. Normal EKG. No ST segment elevation or depression.  No results found for this or any previous visit (from the past 24 hour(s)). Dg Chest 2 View  02/02/2014   CLINICAL DATA:  Lambert ModySharp left-sided chest pain radiating into the left arm and back for 5 hr. Shortness of breath. Initial encounter.  EXAM: CHEST  2 VIEW  COMPARISON:  Abdominal pelvic CT 04/03/2013.  FINDINGS: The heart size and mediastinal contours are normal. The lungs are clear. There is no pleural effusion or pneumothorax. No acute osseous findings are identified.  IMPRESSION: No active cardiopulmonary process.   Electronically Signed   By: Roxy HorsemanBill  Veazey M.D.   On: 02/02/2014 19:34    Assessment and Plan: 38 y.o. female with left periscapular muscle pain. Likely due to myofascial disruption. Plan to treat with heat exercise diclofenac and Flexeril. Followup with primary care provider as needed.  Discussed warning signs or symptoms. Please see discharge instructions. Patient expresses understanding.     Rodolph BongEvan S Tayleigh Wetherell, MD 02/02/14 [id number]

## 2014-02-02 NOTE — ED Notes (Signed)
Pt here today for pain in her left shoulder, side, and middle of her back, pt denies any other symptoms at this time

## 2014-02-02 NOTE — Discharge Instructions (Signed)
Thank you for coming in today. Calambres y espasmos musculares  (Muscle Cramps and Spasms)  Los calambres musculares y espasmos ocurren cuando un msculo o grupos de msculos se tensan y no se tiene control sobre esta tensin (contraccin muscular involuntaria). Es un problema comn y Software engineerpuede aparecer en cualquier msculo. La zona ms comn son los msculos de la pantorrilla. Tanto los Liberty Globalcalambres como los espasmos son contracciones musculares involuntarias, pero tambin tienen diferencias:   Los calambres musculares son espordicos y Engineer, miningdolorosos. Pueden durar entre algunos segundos hasta un cuarto de [place]hora. Los calambres musculares son ms fuertes y duran ms que los espasmos musculares.  Los espasmos pueden o no ser dolorosos. Pueden durar algunos segundos o mucho ms. CAUSAS  No es frecuente que los calambres se deban a un trastorno subyacente grave. En muchos casos, la causa de los calambres y los espasmos es desconocida. Algunas causas frecuentes son:   Esfuerzo excesivo.   El uso excesivo del msculo por movimientos repetitivos (hacer lo mismo una y Laverda Pageotra vez).   Permanecer en cierta posicin durante un largo perodo de Ecorsetiempo.   Preparacin, forma o tcnica inadecuada al realizar un deporte o Malmoactividad.   Deshidratacin.   Traumatismos.   Efectos secundarios de algunos medicamentos.  Niveles anormalmente bajos de las sales e iones en la sangre (electrolitos), especialmente el potasio y el calcio. Pueden ocurrir cuando se toman pldoras para Geographical information systems officerorinar (diurticos) o en las mujeres embarazadas.  Algunos problemas mdicos subyacentes pueden hacer que sea ms propenso a desarrollar calambres o espasmos. Estos incluyen, pero no se limitan a:   Diabetes.   Enfermedad de Parkinson.   Trastornos hormonales, tales como problemas de la tiroides.   El consumo excesivo de alcohol.   Enfermedades especficas de Harrah's Entertainmentlos msculos, las articulaciones y Patokalos huesos.   Enfermedad vascular en  la que no llega suficiente sangre a los msculos.  INSTRUCCIONES PARA EL CUIDADO EN EL HOGAR   Mantngase bien hidratado. Beba gran cantidad de lquido para mantener la orina de tono claro o color amarillo plido.  Puede ser til Engineer, maintenance (IT)masajear, Therapist, musicelongar y International aid/development workerrelajar el msculo afectado.  Para los msculos tensos o apretados, use una toalla caliente, una almohadilla trmica o agua caliente de la ducha dirigida a la zona afectada.  Si est dolorido o siente dolor despus de un calambre o espasmo, aplique hielo en el rea afectada para Acupuncturistaliviar el malestar.  Ponga el hielo en una bolsa plstica.  Colquese una toalla entre la piel y la bolsa de hielo.  Deje el hielo en el lugar durante 15 a 20 minutos, 3 a 4 veces por da.  Los medicamentos que se utilizan para tratar las causas conocidas de los calambres o espasmos pueden reducir su frecuencia o gravedad. Tome slo medicamentos de venta libre o recetados, segn las indicaciones del mdico. SOLICITE ATENCIN MDICA SI:  Los calambres o espasmos empeoran, ocurren con ms frecuencia o no mejoran con Museum/gallery conservatorel tiempo.  ASEGRESE DE QUE:   Comprende estas instrucciones.  Controlar su enfermedad.  Solicitar ayuda de inmediato si no mejora o si empeora. Document Released: 01/25/2005 Document Revised: 08/12/2012 [facility] Patient Information 2015 [place]Care, [place]. This information is not intended to replace advice given to you by your health care provider. Make sure you discuss any questions you have with your health care provider.

## 2014-02-06 ENCOUNTER — Encounter: Payer: Self-pay | Admitting: Family Medicine

## 2014-02-06 ENCOUNTER — Ambulatory Visit: Payer: No Typology Code available for payment source | Attending: Family Medicine | Admitting: Family Medicine

## 2014-02-06 ENCOUNTER — Other Ambulatory Visit (HOSPITAL_COMMUNITY)
Admission: RE | Admit: 2014-02-06 | Discharge: 2014-02-06 | Disposition: A | Discharge status: Home / Self Care | Payer: No Typology Code available for payment source | Source: Ambulatory Visit | Attending: Family Medicine | Admitting: Family Medicine

## 2014-02-06 VITALS — BP 98/66 | HR 79 | Temp 98.2°F | Resp 18 | Ht 60.0 in | Wt 118.0 lb

## 2014-02-06 DIAGNOSIS — Z418 Encounter for other procedures for purposes other than remedying health state: Secondary | ICD-10-CM

## 2014-02-06 DIAGNOSIS — K0889 Other specified disorders of teeth and supporting structures: Secondary | ICD-10-CM

## 2014-02-06 DIAGNOSIS — Z113 Encounter for screening for infections with a predominantly sexual mode of transmission: Secondary | ICD-10-CM | POA: Insufficient documentation

## 2014-02-06 DIAGNOSIS — Z01411 Encounter for gynecological examination (general) (routine) with abnormal findings: Secondary | ICD-10-CM | POA: Insufficient documentation

## 2014-02-06 DIAGNOSIS — K088 Other specified disorders of teeth and supporting structures: Secondary | ICD-10-CM | POA: Insufficient documentation

## 2014-02-06 DIAGNOSIS — Z0000 Encounter for general adult medical examination without abnormal findings: Secondary | ICD-10-CM | POA: Insufficient documentation

## 2014-02-06 DIAGNOSIS — N760 Acute vaginitis: Secondary | ICD-10-CM | POA: Insufficient documentation

## 2014-02-06 DIAGNOSIS — Z23 Encounter for immunization: Secondary | ICD-10-CM

## 2014-02-06 DIAGNOSIS — Z124 Encounter for screening for malignant neoplasm of cervix: Secondary | ICD-10-CM | POA: Insufficient documentation

## 2014-02-06 DIAGNOSIS — Z299 Encounter for prophylactic measures, unspecified: Secondary | ICD-10-CM

## 2014-02-06 DIAGNOSIS — Z1151 Encounter for screening for human papillomavirus (HPV): Secondary | ICD-10-CM | POA: Insufficient documentation

## 2014-02-06 MED ORDER — NORETHINDRONE-ETH ESTRADIOL 0.5-35 MG-MCG PO TABS: 1.0000 | ORAL_TABLET | Freq: Every day | ORAL | Status: DC

## 2014-02-06 NOTE — Assessment & Plan Note (Signed)
A: normal oral exam P: Dentistry referral for tooth pain

## 2014-02-06 NOTE — Assessment & Plan Note (Signed)
Flu, Tdap, pneumovax done today

## 2014-02-06 NOTE — Progress Notes (Signed)
   Subjective:    Patient ID: Colleen Warner, female    DOB: 11/02/1975, 38 y.o.   MRN: [id number] CC: physical with pap smear  HPI  Acute complaints/concerns: request dental referral for painful L upper incisor. No pain now.   Soc hx: non smoker  Review of Systems General:  Negative for unexplained weight loss, fever Skin: Negative for new or changing mole, sore that won't heal HEENT: Positive for eye fatigue when reading.  Negative for trouble hearing, trouble seeing, ringing in ears, mouth sores, hoarseness, change in voice, dysphagia. CV:  Negative for chest pain, dyspnea, edema, palpitations Resp: Negative for cough, dyspnea, hemoptysis GI: Negative for nausea, vomiting, diarrhea, constipation, abdominal pain, melena, hematochezia. GU: Negative for dysuria, incontinence, urinary hesitance, hematuria, vaginal or penile discharge, polyuria, sexual difficulty, lumps in testicle or breasts MSK: Negative for muscle cramps or aches, joint pain or swelling Neuro: Negative for headaches, weakness, numbness, dizziness, passing out/fainting Psych: Negative for depression, anxiety, memory problems     Objective:   Physical Exam BP 98/66  Pulse 79  Temp(Src) 98.2 F (36.8 C) (Oral)  Resp 18  Ht 5' (1.524 m)  Wt 118 lb (53.524 kg)  BMI 23.05 kg/m2  SpO2 99%  LMP 02/03/2014  General Appearance:    Alert, cooperative, no distress, appears stated age  Head:    Normocephalic, without obvious abnormality, atraumatic  Eyes:    PERRL, conjunctiva/corneas clear, EOM's intact both eyes  Ears:    Normal TM's and external ear canals, both ears  Nose:   Nares normal, septum midline, mucosa normal, no drainage    or sinus tenderness  Throat:   Lips, mucosa, and tongue normal; teeth and gums normal  Neck:   Supple, symmetrical, trachea midline, no adenopathy;    thyroid:  no enlargement/tenderness/nodules;   Back:     Symmetric, no curvature, ROM normal, no CVA tenderness  Lungs:      Clear to auscultation bilaterally, respirations unlabored  Chest Wall:    No tenderness or deformity   Heart:    Regular rate and rhythm, S1 and S2 normal, no murmur, rub   or gallop  Breast Exam:    No tenderness, masses, or nipple abnormality  Abdomen:     Soft, non-tender, bowel sounds active all four quadrants,    no masses, no organomegaly  Genitalia:    Normal female without lesion, discharge or tenderness  Rectal:    Deferred   Extremities:   Extremities normal, atraumatic, no cyanosis or edema  Pulses:   2+ and symmetric all extremities  Skin:   Skin color, texture, turgor normal, no rashes or lesions  Lymph nodes:   Cervical, supraclavicular, and axillary nodes normal  Neurologic:   CNII-XII intact, normal strength, sensation and reflexes    throughout         Assessment & Plan:

## 2014-02-06 NOTE — Progress Notes (Signed)
Annual physical and pap 

## 2014-02-06 NOTE — Assessment & Plan Note (Signed)
Pap done today  

## 2014-02-06 NOTE — Patient Instructions (Signed)
Senora Archaga-Umanzo,  Thank you for coming in today. Normal physical  You will be called with pap results.  Next physical in one year.    Dr. Armen PickupFunches   For your mom: look into PACE To qualify she needs Medicare or some form of insurance.  [email or web address]

## 2014-02-09 ENCOUNTER — Telehealth: Payer: Self-pay | Admitting: *Deleted

## 2014-02-09 NOTE — Addendum Note (Signed)
Addended by: Dessa PhiFUNCHES, Raysha Tilmon on: 02/09/2014 03:32 PM   Modules accepted: Orders

## 2014-02-10 LAB — CYTOLOGY - PAP

## 2014-02-10 LAB — CERVICOVAGINAL ANCILLARY ONLY
CHLAMYDIA, DNA PROBE: NEGATIVE
Neisseria Gonorrhea: NEGATIVE
Wet Prep (BD Affirm): NEGATIVE
Wet Prep (BD Affirm): NEGATIVE
Wet Prep (BD Affirm): NEGATIVE

## 2014-03-02 ENCOUNTER — Encounter: Payer: Self-pay | Admitting: Family Medicine

## 2014-03-23 ENCOUNTER — Ambulatory Visit: Payer: No Typology Code available for payment source | Attending: Internal Medicine

## 2014-04-22 NOTE — Telephone Encounter (Signed)
Disregard

## 2014-06-09 ENCOUNTER — Ambulatory Visit: Payer: Self-pay | Attending: Family Medicine | Admitting: Family Medicine

## 2014-06-09 ENCOUNTER — Encounter: Payer: Self-pay | Admitting: Family Medicine

## 2014-06-09 VITALS — BP 107/75 | HR 62 | Temp 98.4°F | Resp 16 | Ht 59.5 in | Wt 125.6 lb

## 2014-06-09 DIAGNOSIS — J309 Allergic rhinitis, unspecified: Secondary | ICD-10-CM | POA: Insufficient documentation

## 2014-06-09 DIAGNOSIS — G5603 Carpal tunnel syndrome, bilateral upper limbs: Secondary | ICD-10-CM

## 2014-06-09 DIAGNOSIS — G5601 Carpal tunnel syndrome, right upper limb: Secondary | ICD-10-CM | POA: Insufficient documentation

## 2014-06-09 DIAGNOSIS — G5602 Carpal tunnel syndrome, left upper limb: Secondary | ICD-10-CM | POA: Insufficient documentation

## 2014-06-09 DIAGNOSIS — H542 Low vision, both eyes: Secondary | ICD-10-CM | POA: Insufficient documentation

## 2014-06-09 DIAGNOSIS — H547 Unspecified visual loss: Secondary | ICD-10-CM

## 2014-06-09 HISTORY — DX: Allergic rhinitis, unspecified: J30.9

## 2014-06-09 HISTORY — DX: Carpal tunnel syndrome, bilateral upper limbs: G56.03

## 2014-06-09 LAB — CBC
HCT: 38.3 % (ref 36.0–46.0)
Hemoglobin: 12.6 g/dL (ref 12.0–15.0)
MCH: 28.4 pg (ref 26.0–34.0)
MCHC: 32.9 g/dL (ref 30.0–36.0)
MCV: 86.5 fL (ref 78.0–100.0)
MPV: 9.3 fL (ref 8.6–12.4)
Platelets: 217 10*3/uL (ref 150–400)
RBC: 4.43 MIL/uL (ref 3.87–5.11)
RDW: 13.6 % (ref 11.5–15.5)
WBC: 7.5 10*3/uL (ref 4.0–10.5)

## 2014-06-09 MED ORDER — IBUPROFEN 600 MG PO TABS: 600.0000 mg | ORAL_TABLET | Freq: Three times a day (TID) | ORAL | Status: DC | PRN

## 2014-06-09 MED ORDER — DIPHENHYDRAMINE HCL 25 MG PO TABS: 50.0000 mg | ORAL_TABLET | Freq: Every day | ORAL | Status: DC

## 2014-06-09 NOTE — Progress Notes (Signed)
   Subjective:    Patient ID: Verner MouldJulissa Archaga-Umanzor, female    DOB: Jul 24, 1975, 39 y.o.   MRN: [id number] CC:  Puffy lower eyelids, wrist and hand pain  HPI 39 yo Hispanic female: Spanish interpreter present  1. Puffy lower eyelids: x 3 months. Every morning. No itching, redness,rash or eye discharge. No treatments.  2. Wrist pain: x 3 weeks. R mostly. L also. Worse at night. With numbness. No trauma, erythema or joint swelling.   Soc Hx: non smoker   Review of Systems As per HPI     Objective:   Physical Exam BP 107/75 mmHg  Pulse 62  Temp(Src) 98.4 F (36.9 C)  Resp 16  Ht 4' 11.5" (1.511 m)  Wt 125 lb 9.6 oz (56.972 kg)  BMI 24.95 kg/m2  SpO2 99%  LMP 05/22/2014 General appearance: alert, cooperative and no distress Eyes: conjunctivae/corneas clear. PERRL, EOM's intact.  Ears: normal TM's and external ear canals both ears except for scarring on both TMs Nose: no discharge, turbinates pink, swollen Extremities: wrist with full ROM, 2+ radial pulses, normal grip strength, negative phalen      Assessment & Plan:

## 2014-06-09 NOTE — Progress Notes (Signed)
Patient complains of swelling of eyes Patient states she was eating and she felt a "pop" in her left ear and a warms sensation as if liquid was coming out of it

## 2014-06-09 NOTE — Assessment & Plan Note (Signed)
A; eyelid puffiness and nasal swelling P: Benadryl nightly

## 2014-06-09 NOTE — Assessment & Plan Note (Signed)
3. Wrist and hand pain with numbness at night: carpal tunnel Wear wrist splints for 8 hrs at night Take ibuprofen 600 mg with food as needed for pain, I recommend taking it at supper to help with nighttime pain Labs: vit b12 level, CBC, CMP  You will be called with lab results  F/u in 8 weeks for carpal tunnel, call in come in sooner if pain or numbness worsens

## 2014-06-09 NOTE — Patient Instructions (Signed)
Colleen Warner,  Thank you for coming in today.  1. Swollen eye lids: take benadryl 25-50 mg at night   2. Decreased vision:  Referral to eye doctor You may also call these low cost eye doctors Low out cost optometrist (about $65.00 for office visit)  1. Dr. Wynona Luna Phone # [phone] [address]  [place], [place] [address]   2. [facility]  Phone # [phone] [address] [place], [place] [address]   3. Wrist and hand pain with numbness at night: carpal tunnel Wear wrist splints for 8 hrs at night Take ibuprofen 600 mg with food as needed for pain, I recommend taking it at supper to help with nighttime pain Labs: vit b12 level, CBC, CMP  You will be called with lab results  F/u in 8 weeks for carpal tunnel, call in come in sooner if pain or numbness worsens  Dr. Armen Pickup  Sndrome del tnel carpiano (Carpal Tunnel Syndrome) El sndrome del tnel carpiano es una afeccin del sistema nervioso en la [place], que causa dolor, debilidad y prdida de la sensibilidad. La causa es la compresin, estiramiento o irritacin del nervio mediano en la articulacin de la [place]. Los deportistas que sufren este sndrome notan un disminucin en su rendimiento, especialmente en los deportes que requieren fuerza en las manos o accin de la [place]. SNTOMAS  Adormecimiento, hormigueo o Economist mano o los dedos.  Dificultad para dormir debido al [employer].  Dolor agudo que se irradia desde la mueca hacia el brazo o hacia los dedos, especialmente por la noche.  Rigidez o calambres en la mano por las [place].  Debilidad en el pulgar que da como resultado la dificultad para sostener objetos o formar el puo.  La piel de la mano est brillante y [name].  Disminucin del rendimiento en todos los deportes que requieran tomar un objeto con fuerza. CAUSAS  La lesin del nervio mediano en la mueca se produce por la presin que produce la  hinchazn, la inflamacin o el tejido cicatrizal.  Entre las causas de la compresin se encuentran:  Movimientos repetitivos para asir o apretar que producen inflamacin en la vaina del tendn.  Cicatrizacin o reduccin del ligamento que cubre el nervio mediano.  Lesin traumtica en la mueca o en el antebrazo, como una fractura, esguince o dislocacin.  Hiperextensin (hacia atrs) o hiperflexin (hacia abajo) prolongada de la [place]. LOS RIESGOS AUMENTAN CON  Diabetes mellitus.  Menopausia o amenorrea  Artritis reumatoidea  Sndrome de Raynaud  Embarazo.  Gota  Enfermedades renales.  Quistes ganglionares  Accin repetitiva de la mano o la [place].  Hipotiroidismo (glndula tiroides con Marketing executive a la normal).  Sacudidas o zarandeos repetitivos de la mano o la [place].  Soportar un Scientific laboratory technician en la mano de [place] forzosa y prolongada PREVENCIN  Vende la mano y la mueca para que permanezcan rectas durante las actividades que implican movimientos repetitivos para asir objetos.  En las actividades que requieren extensin prolongada de la mueca (doblarla hacia la zona superior del antebrazo) cambie peridicamente la posicin de la [place].  Conozca y Public affairs consultant tcnica [place] en las actividades que le permitan mantener la mueca en una posicin neutral o en una ligera extensin.  Evite doblar la Time Warner en su extensin o flexin completa (hacia Asencion Partridge).  Mantenga la mueca en una posicin extendida (neutral). Para mantener la Owens & Minor posicin, utilice una tablilla.  Evite los movimientos repetitivos de la  mano y Warden/rangerla mueca.  Cuando sea posible, evite asir Marshall & Ilsleyobjetos durante mucho tiempo (el volante de un auto, una lapicera, la aspiradora o un rastrillo.  Si debe asir Marshall & Ilsleyobjetos durante mucho tiempo, hgalo de Meadvillemanera relajada.  Coloque los pizarrones y las superficies en las que debe escribir a una altura adecuada para disminuir la tensin  en la [place]ueca y en la [place].  Alterne las tareas y evite la flexin prolongada de la Kentonmueca.  Evite las Northeast Utilitiesactividades en las que deba pinchar (costura o Writerescritura) ya que pueden irritar el tnel carpiano  Si es necesario que realice estas actividades, hgalas durante breves perodos de Tallaboatiempo.  Use una lapicera con punta de fieltro o una roller ball, o engrose la lapicera para disminuir la fuerza requerida para Programmer, applicationsescribir. PRONSTICO Este sndrome puede curarse con el tratamiento adecuado y en algunos casos se resuelve espontneamente. En otros casos puede ser necesario someterse a una ciruga, especialmente si el msculo est afectado o aparecen alteraciones neurolgicas.  POSIBLES COMPLICACIONES:  Adormecimiento permanente y debilidad en el pulgar o en los dedos de la mano afectada.  Parlisis permanente de una porcin de la mano y los dedos. TRATAMIENTO El tratamiento inicial consiste en suspender las actividades que Countrywide Financialempeoran los sntomas, y el uso de medicamentos o hielo para reducir la inflamacin. En algunos casos se recomienda un cabestrillo para la Dud[place]mueca, que deber usar Schering-Ploughdurante las actividades que impliquen movimientos repetitivos, y tambin durante la noche. Tambin es importante que aprenda y Tokelauutilice las tcnicas adecuadas cuando deba realizar actividades que le causen dolor. En ocasiones se indica una inyeccin de corticoides. Si los sntomas persisten luego del tratamiento conservador, la ciruga puede ser una opcin. Las tcnicas quirrgicas liberan el nervio comprimido. Este tipo de Azerbaijanciruga se Designer, fashion/clothingrealiza en forma ambulatoria, lo que significa que podr PG&E Corporationvolver a su casa el mismo da de la Azerbaijanciruga. Este procedimiento brinda alivio en forma casi completa de todos los sntomas, en el 95% de los Alliancepacientes. Permita que transcurran al The PNC Financialmenos dos semanas para ver la curacin luego de la Azerbaijanciruga. Cuando el problema surge por repetidas sacudidas de la mano o la Pione[place] Junctionmueca, o por prolongada hiperextensin,  no se recomienda la Azerbaijanciruga, porque es el estiramiento del nervio mediano y no la compresin lo que causa el sndrome en estos casos. MEDICAMENTOS  Si es necesaria la administracin de medicamentos para Chief Technology Officerel dolor, se recomiendan los antiinflamatorios no esteroides, como aspirina e ibuprofeno y otros calmantes menores, como acetaminofeno.  No tome medicamentos para el dolor dentro de los [address] das previos a la Azerbaijanciruga.  En general, luego de la ciruga se prescriben calmantes. Utilcelos como se le indique y slo cuando lo necesite.  En algunos casos se indica una inyeccin de corticoides para reducir la inflamacin. Sin embargo, no siempre se indican.  La vitamina B6 (piridoxina) puede reducir los sntomas: sela slo si se la indican para su problema. SOLICITE ATENCIN MDICA SI:   Los sntomas empeoran o no mejoran en 2 semanas, a pesar de Medical illustratorrealizar el tratamiento.  Si tuviera antecedentes recientes de traumatismo del cuello o del hombro que le hubieran ocasionado dolor u hormigueos en alguna parte del brazo. Document Released: 02/01/2006 Document Revised: 07/10/2011 [facility] ManagementExitCare Patient Information 2015 [place]eeExitCare, MarylandLLC. This information is not intended to replace advice given to you by your health care provider. Make sure you discuss any questions you have with your health care provider.

## 2014-06-09 NOTE — Assessment & Plan Note (Signed)
A: decreased visual acuity noted  P: Optometry referral

## 2014-06-10 LAB — COMPLETE METABOLIC PANEL WITH GFR
ALK PHOS: 72 U/L (ref 39–117)
ALT: 14 U/L (ref 0–35)
AST: 18 U/L (ref 0–37)
Albumin: 4.4 g/dL (ref 3.5–5.2)
BILIRUBIN TOTAL: 0.6 mg/dL (ref 0.2–1.2)
BUN: 11 mg/dL (ref 6–23)
CO2: 25 mEq/L (ref 19–32)
CREATININE: 0.69 mg/dL (ref 0.50–1.10)
Calcium: 9.3 mg/dL (ref 8.4–10.5)
Chloride: 105 mEq/L (ref 96–112)
GFR, Est Non African American: >89 mL/min
Glucose, Bld: 79 mg/dL (ref 70–99)
Potassium: 4.5 mEq/L (ref 3.5–5.3)
SODIUM: 140 meq/L (ref 135–145)
Total Protein: 6.8 g/dL (ref 6.0–8.3)

## 2014-06-10 LAB — VITAMIN B12: Vitamin B-12: 391 pg/mL (ref 211–911)

## 2014-06-12 ENCOUNTER — Telehealth: Payer: Self-pay | Admitting: *Deleted

## 2014-06-12 NOTE — Telephone Encounter (Signed)
Pt aware of lab results 

## 2014-06-12 NOTE — Telephone Encounter (Signed)
-----   Message from Lora PaulaJosalyn C Funches, MD sent at 06/10/2014  2:13 PM EST ----- All labs normal

## 2014-07-06 ENCOUNTER — Ambulatory Visit: Payer: Self-pay

## 2014-09-30 ENCOUNTER — Telehealth: Payer: Self-pay | Admitting: Family Medicine

## 2014-10-12 ENCOUNTER — Telehealth: Payer: Self-pay | Admitting: Family Medicine

## 2014-10-12 NOTE — Telephone Encounter (Signed)
Pt advised has Rx at [facility] pharmacy.

## 2014-11-03 ENCOUNTER — Ambulatory Visit (INDEPENDENT_AMBULATORY_CARE_PROVIDER_SITE_OTHER): Payer: Self-pay | Admitting: Urgent Care

## 2014-11-03 VITALS — BP 92/64 | HR 77 | Temp 98.2°F | Resp 16 | Ht 58.5 in | Wt 120.4 lb

## 2014-11-03 DIAGNOSIS — L739 Follicular disorder, unspecified: Secondary | ICD-10-CM | POA: Insufficient documentation

## 2014-11-03 MED ORDER — CEPHALEXIN 500 MG PO CAPS: 500.0000 mg | ORAL_CAPSULE | Freq: Three times a day (TID) | ORAL | Status: DC

## 2014-11-03 NOTE — Patient Instructions (Addendum)
Foliculitis  (Folliculitis)  La foliculitis es el enrojecimiento, dolor e hinchazn (inflamacin) de los folculos pilosos. Puede ocurrir en cualquier parte del cuerpo. Las personas con un sistema inmunolgico debilitado, con diabetes u obesidad tienen mayor riesgo de sufrir foliculitis.  CAUSAS   Infecciones bacterianas. sta es la causa ms frecuente.  Infecciones por hongos.  Infecciones virales.  Contacto con ciertas sustancias qumicas, especialmente aceites y alquitrn. La foliculitis crnica puede ser el resultado de las bacterias que viven en las fosas nasales. Las bacterias pueden favorecer los brotes de foliculitis con el tiempo.  SNTOMAS  La foliculitis ocurre con mayor frecuencia en el cuero cabelludo, los muslos, las piernas, la espalda, las nalgas y las reas donde el pelo se afeita con frecuencia. Una primera seal de foliculitis es una , lesin pequea llena de pus, de color blanco o amarillo, y que pica (pstula).. Estas lesiones aparecen en un folculo inflamado y rojo. Por lo general miden menos de 0.2 pulgadas (5 mm) de ancho. Cuando hay una infeccin del folculo que se hace ms profundo, se convierte en un fornculo. Un grupo de varios fornculos juntos forma una lesin mayor (carbunclo). El carbunclo ocurre en reas pilosas y sudorosas del cuerpo.  DIAGNSTICO  El mdico har el diagnstico con un examen fsico. Podr tomarle una muestra de una de las lesiones y analizarla en un labloratorio. As se determinar la causa de la foliculitis.  TRATAMIENTO  El tratamiento podr incluir:   La aplicacin de compresas calientes en la zona afectada.  Tomar antibiticos por va oral o aplicarlos sobre la piel.  Drenaje de las lesiones si contienen una gran cantidad de pus o lquido.  Depilacin lser para casos de foliculitis de larga duracin. Esto ayuda a evitar el nuevo crecimiento del pelo. INSTRUCCIONES PARA EL CUIDADO EN EL HOGAR   Aplique compresas calientes en la  zona afectada segn lo indique su mdico.  Si le han recetado medicamentos, tmelos segn las indicaciones. Tmelos todos, aunque se sienta mejor.  Tome medicamentos de venta libre para aliviar la picazn.  No rasure la piel irritada.  Concurra a las visitas de control con el mdico, segn las indicaciones. SOLICITE ATENCIN MDICA DE INMEDIATO SI:   Observa un aumento del enrojecimiento, hinchazn o dolor en la zona afectada.  Tiene fiebre. ASEGRESE DE QUE:   Comprende estas instrucciones.  Controlar su enfermedad.  Solicitar ayuda de inmediato si no mejora o si empeora. Document Released: 04/17/2005 Document Revised: 10/17/2011 ExitCare Patient Information 2015 ExitCare, LLC. This information is not intended to replace advice given to you by your health care provider. Make sure you discuss any questions you have with your health care provider.  

## 2014-11-03 NOTE — Progress Notes (Signed)
    MRN: [id number] DOB: 11/26/1975  Subjective:   Colleen Warner is a 39 y.o. female presenting for chief complaint of Rash  Reports 3 day history of rash over her lower abdomen. Rash is itchy and intermittently stingy. Has tried otc itch relief cream. Denies fevers, pain, tingling, drainage of pus or bleeding, oral lesions, lesions over palms and feet. Patient admits that she sometimes wears tight clothing including skinny jeans. She does not recall any sick contacts, new exposures or times spent outdoors. No other family members have rash. Denies any other aggravating or relieving factors, no other questions or concerns.  [name] has a current medication list which includes the following prescription(s): norethindrone-ethinyl estradiol. She has No Known Allergies.  [name]  has a past medical history of No pertinent past medical history; Anemia; UTI (urinary tract infection); Kidney stones; and Mental disorder. Also  has past surgical history that includes No past surgeries.  ROS As in subjective.  Objective:   Vitals: BP 92/64 mmHg  Pulse 77  Temp(Src) 98.2 F (36.8 C) (Oral)  Resp 16  Ht 4' 10.5" (1.486 m)  Wt 120 lb 6.4 oz (54.613 kg)  BMI 24.73 kg/m2  SpO2 99%  LMP 10/28/2014  Physical Exam  Constitutional: She is oriented to person, place, and time. She appears well-developed and well-nourished.  HENT:  Mouth/Throat: Oropharynx is clear and moist.  Eyes: Conjunctivae are normal. No scleral icterus.  Neck: Normal range of motion.  Cardiovascular: Normal rate.   Pulmonary/Chest: Effort normal.  Musculoskeletal: Normal range of motion. She exhibits no edema or tenderness.  Lymphadenopathy:    She has no cervical adenopathy.  Neurological: She is alert and oriented to person, place, and time.  Skin: Skin is warm and dry. Rash noted. No erythema. No pallor.     Psychiatric: She has a normal mood and affect.   Assessment and Plan :   1. Folliculitis - Start  Keflex x7 days, use oral antihistamine for itching, wear loose clothing. Considered herpes zoster but less likely given distribution and lack of tenderness. Consider contact dermatitis, follow up in 7 days if no improvement.  Wallis BambergMario Dylann Layne, PA-C [facility] [phone] 11/03/2014 11:09 AM

## 2014-12-18 ENCOUNTER — Ambulatory Visit: Payer: Self-pay | Attending: Family Medicine

## 2014-12-25 ENCOUNTER — Ambulatory Visit: Payer: Self-pay

## 2014-12-28 ENCOUNTER — Ambulatory Visit: Payer: Self-pay | Attending: Family Medicine

## 2015-01-05 ENCOUNTER — Other Ambulatory Visit: Payer: Self-pay

## 2015-01-05 DIAGNOSIS — Z1231 Encounter for screening mammogram for malignant neoplasm of breast: Secondary | ICD-10-CM

## 2015-01-08 ENCOUNTER — Ambulatory Visit
Admission: RE | Admit: 2015-01-08 | Discharge: 2015-01-08 | Disposition: A | Discharge status: Home / Self Care | Payer: No Typology Code available for payment source | Source: Ambulatory Visit

## 2015-01-08 DIAGNOSIS — Z1231 Encounter for screening mammogram for malignant neoplasm of breast: Secondary | ICD-10-CM

## 2015-03-09 ENCOUNTER — Ambulatory Visit: Payer: No Typology Code available for payment source | Attending: Family Medicine | Admitting: Family Medicine

## 2015-03-09 ENCOUNTER — Encounter: Payer: Self-pay | Admitting: Family Medicine

## 2015-03-09 VITALS — BP 98/64 | HR 80 | Temp 97.7°F | Resp 98 | Ht <= 58 in | Wt 124.0 lb

## 2015-03-09 DIAGNOSIS — R109 Unspecified abdominal pain: Secondary | ICD-10-CM | POA: Insufficient documentation

## 2015-03-09 DIAGNOSIS — J309 Allergic rhinitis, unspecified: Secondary | ICD-10-CM

## 2015-03-09 DIAGNOSIS — R1032 Left lower quadrant pain: Secondary | ICD-10-CM

## 2015-03-09 HISTORY — DX: Left lower quadrant pain: R10.32

## 2015-03-09 LAB — COMPLETE METABOLIC PANEL WITH GFR
ALT: 14 U/L (ref 6–29)
AST: 27 U/L (ref 10–30)
Albumin: 3.9 g/dL (ref 3.6–5.1)
Alkaline Phosphatase: 54 U/L (ref 33–115)
BILIRUBIN TOTAL: 0.7 mg/dL (ref 0.2–1.2)
BUN: 11 mg/dL (ref 7–25)
CO2: 27 mmol/L (ref 20–31)
Calcium: 9.4 mg/dL (ref 8.6–10.2)
Chloride: 101 mmol/L (ref 98–110)
Creat: 0.54 mg/dL (ref 0.50–1.10)
GFR, Est Non African American: >89 mL/min (ref >=60)
GLUCOSE: 75 mg/dL (ref 65–99)
Potassium: 4.2 mmol/L (ref 3.5–5.3)
Sodium: 136 mmol/L (ref 135–146)
TOTAL PROTEIN: 6.6 g/dL (ref 6.1–8.1)

## 2015-03-09 LAB — POCT URINALYSIS DIPSTICK
BILIRUBIN UA: NEGATIVE
Glucose, UA: NEGATIVE
KETONES UA: NEGATIVE
Nitrite, UA: NEGATIVE
Protein, UA: NEGATIVE
Spec Grav, UA: 1.025
Urobilinogen, UA: 0.2
pH, UA: 6

## 2015-03-09 LAB — LIPASE: Lipase: 22 U/L (ref 7–60)

## 2015-03-09 LAB — CBC
HCT: 38.4 % (ref 36.0–46.0)
Hemoglobin: 12.6 g/dL (ref 12.0–15.0)
MCH: 29 pg (ref 26.0–34.0)
MCHC: 32.8 g/dL (ref 30.0–36.0)
MCV: 88.3 fL (ref 78.0–100.0)
MPV: 9.5 fL (ref 8.6–12.4)
PLATELETS: 231 10*3/uL (ref 150–400)
RBC: 4.35 MIL/uL (ref 3.87–5.11)
RDW: 13.5 % (ref 11.5–15.5)
WBC: 8 10*3/uL (ref 4.0–10.5)

## 2015-03-09 LAB — POCT URINE PREGNANCY: Preg Test, Ur: NEGATIVE

## 2015-03-09 LAB — POCT GLYCOSYLATED HEMOGLOBIN (HGB A1C): HEMOGLOBIN A1C: 5.1

## 2015-03-09 LAB — GLUCOSE, POCT (MANUAL RESULT ENTRY): POC Glucose: 86 mg/dl (ref 70–99)

## 2015-03-09 MED ORDER — NORETHINDRONE-ETH ESTRADIOL 0.5-35 MG-MCG PO TABS: 1.0000 | ORAL_TABLET | Freq: Every day | ORAL | Status: DC

## 2015-03-09 MED ORDER — FLUTICASONE PROPIONATE 50 MCG/ACT NA SUSP: 2.0000 | Freq: Every day | NASAL | Status: DC

## 2015-03-09 NOTE — Assessment & Plan Note (Signed)
A: LLQ pain w normal exam. No red flags P: Reassurance Labs per orders

## 2015-03-09 NOTE — Progress Notes (Signed)
C/C Abdominal pain, nausea and low energy  No pain today  Stated eye swelling in the AM  No tobacco user

## 2015-03-09 NOTE — Patient Instructions (Addendum)
[name] was seen today for abdominal pain.  Diagnoses and all orders for this visit:  LLQ abdominal pain -     POCT urinalysis dipstick -     POCT urine pregnancy -     Glucose (CBG) -     HgB A1c -     CBC -     COMPLETE METABOLIC PANEL WITH GFR -     Lipase  Allergic rhinitis, unspecified allergic rhinitis type -     fluticasone (FLONASE) 50 MCG/ACT nasal spray; Place 2 sprays into both nostrils daily.  Other orders -     norethindrone-ethinyl estradiol (BREVICON, 28,) 0.5-35 MG-MCG tablet; Take 1 tablet by mouth daily.   Ginger for nausea Ibuprofen 600 mg up to 3 times a day with food as needed for abdominal pain  F/u in 6 weeks if pain worsens or fails to improve, otherwise f/u in one year for physical   Dr. Armen PickupFunches

## 2015-03-09 NOTE — Progress Notes (Signed)
Subjective:  Patient ID: Colleen Warner, female    DOB: 1975-07-13  Age: 39 y.o. MRN: [id number] Spanish interpreter used   CC: Abdominal Pain   HPI Colleen Warner presents for   1. Abdominal pain: LLQ pain with nausea, small volume emesis, and fatigue for 2 weeks. She is not taking any medication. She took transfer factors a few months ago to "cleanse liver and colon".  No fever, chills, weight loss, blood in stool. Diarrhea or constipation.   2. Swollen eyes: in AM with itching. No redness. No eye pain. No fever. This is a chronic problem.   Social History  Substance Use Topics  . Smoking status: Never Smoker   . Smokeless tobacco: Not on file  . Alcohol Use: No     Comment: ETOH with pregnancy   Past Medical History  Diagnosis Date  . No pertinent past medical history   . Anemia   . UTI (urinary tract infection)   . Kidney stones   . Mental disorder     depression   Past Surgical History  Procedure Laterality Date  . No past surgeries     Outpatient Prescriptions Prior to Visit  Medication Sig Dispense Refill  . cephALEXin (KEFLEX) 500 MG capsule Take 1 capsule (500 mg total) by mouth 3 (three) times daily. 21 capsule 0  . norethindrone-ethinyl estradiol (BREVICON, 28,) 0.5-35 MG-MCG tablet Take 1 tablet by mouth daily. 1 Package 11   No facility-administered medications prior to visit.    ROS Review of Systems  Constitutional: Positive for fatigue. Negative for fever and chills.  Eyes: Negative for visual disturbance.  Respiratory: Negative for shortness of breath.   Cardiovascular: Negative for chest pain.  Gastrointestinal: Positive for nausea, vomiting and abdominal pain. Negative for diarrhea, blood in stool, anal bleeding and rectal pain.  Musculoskeletal: Negative for back pain and arthralgias.  Skin: Negative for rash.  Allergic/Immunologic: Negative for immunocompromised state.  Neurological: Positive for dizziness. Negative for  light-headedness.  Hematological: Negative for adenopathy. Does not bruise/bleed easily.  Psychiatric/Behavioral: Negative for suicidal ideas and dysphoric mood.    Objective:  BP 98/64 mmHg  Pulse 80  Temp(Src) 97.7 F (36.5 C) (Oral)  Resp 98  Ht  (1.473 m)  Wt 124 lb (56.246 kg)  BMI 25.92 kg/m2  SpO2 100%  LMP 02/14/2015  BP/Weight 03/09/2015 11/03/2014 06/09/2014  Systolic BP - 92 119  Diastolic BP - 64 75  Wt. (Lbs) 124 120.4 125.6  BMI 25.47 24.73 24.95   Physical Exam  Constitutional: She is oriented to person, place, and time. She appears well-developed and well-nourished. No distress.  HENT:  Head: Normocephalic and atraumatic.  Nose: Mucosal edema present.  Eyes: Conjunctivae are normal. Pupils are equal, round, and reactive to light.  Cardiovascular: Normal rate, regular rhythm, normal heart sounds and intact distal pulses.   Pulmonary/Chest: Effort normal and breath sounds normal.  Abdominal: Soft. Normal appearance and bowel sounds are normal. There is no hepatosplenomegaly. There is no tenderness. There is no rebound and no CVA tenderness.  Musculoskeletal: She exhibits no edema.  Neurological: She is alert and oriented to person, place, and time.  Skin: Skin is warm and dry. No rash noted.  Psychiatric: She has a normal mood and affect.    Assessment & Plan:   Problem List Items Addressed This Visit    Allergic rhinitis   Relevant Medications   fluticasone (FLONASE) 50 MCG/ACT nasal spray   LLQ abdominal pain - Primary  Relevant Orders   POCT urinalysis dipstick (Completed)   POCT urine pregnancy (Completed)   Glucose (CBG) (Completed)   HgB A1c (Completed)   CBC   COMPLETE METABOLIC PANEL WITH GFR   Lipase      No orders of the defined types were placed in this encounter.    Follow-up: No Follow-up on file.   Dessa PhiJosalyn Nikala Walsworth MD

## 2015-03-09 NOTE — Assessment & Plan Note (Signed)
A: chronic problem P: flonase at night

## 2015-03-11 ENCOUNTER — Telehealth: Payer: Self-pay | Admitting: *Deleted

## 2015-03-11 NOTE — Telephone Encounter (Signed)
-----   Message from Dessa PhiJosalyn Funches, MD sent at 03/10/2015  8:56 AM EST ----- All labs normal

## 2015-03-22 IMAGING — CR DG CHEST 2V
2 series · 2 of 2 positions shown · non-contrast
Comparison: Abdominal pelvic CT 04/03/2013.

CLINICAL DATA: Sharp left-sided chest pain radiating into the left
arm and back for 5 hr. Shortness of breath. Initial encounter.

EXAM:
CHEST  2 VIEW

[w chest pa]
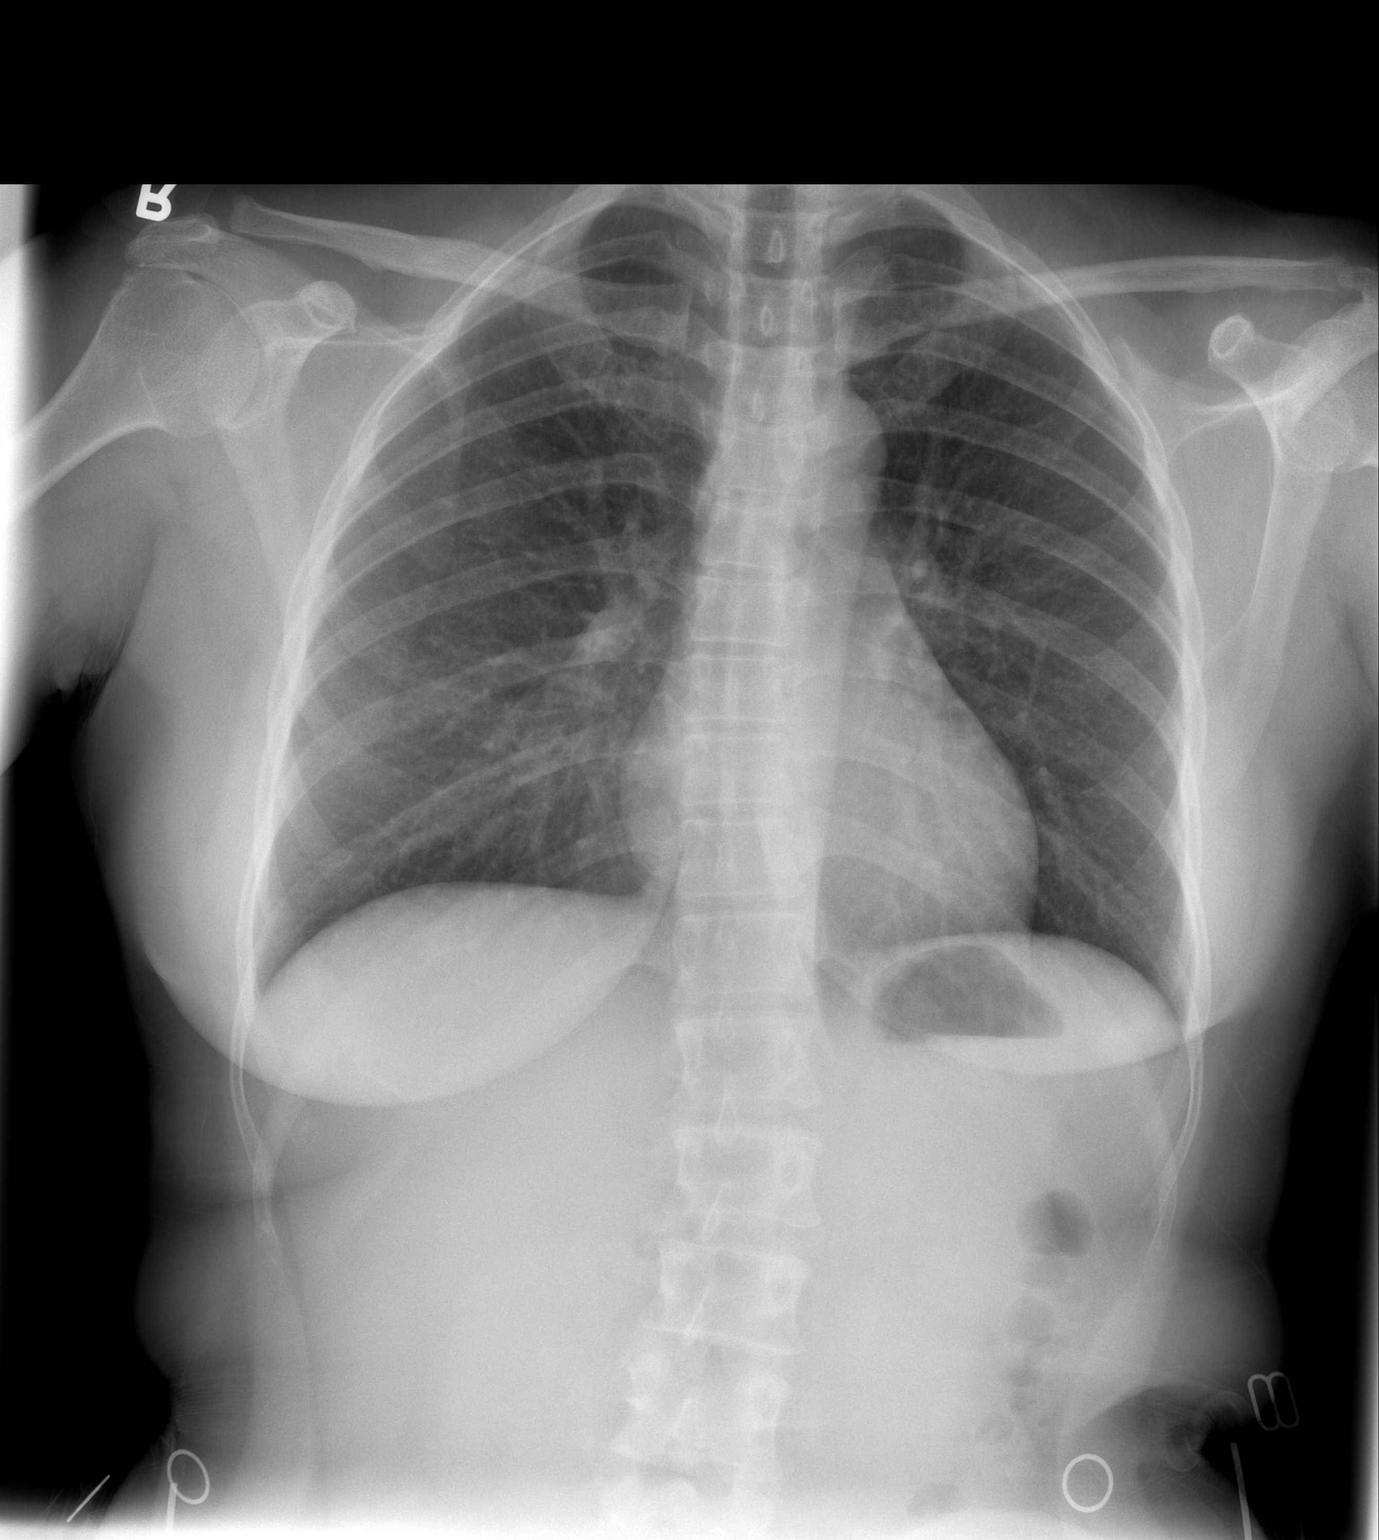

[w chest lat]
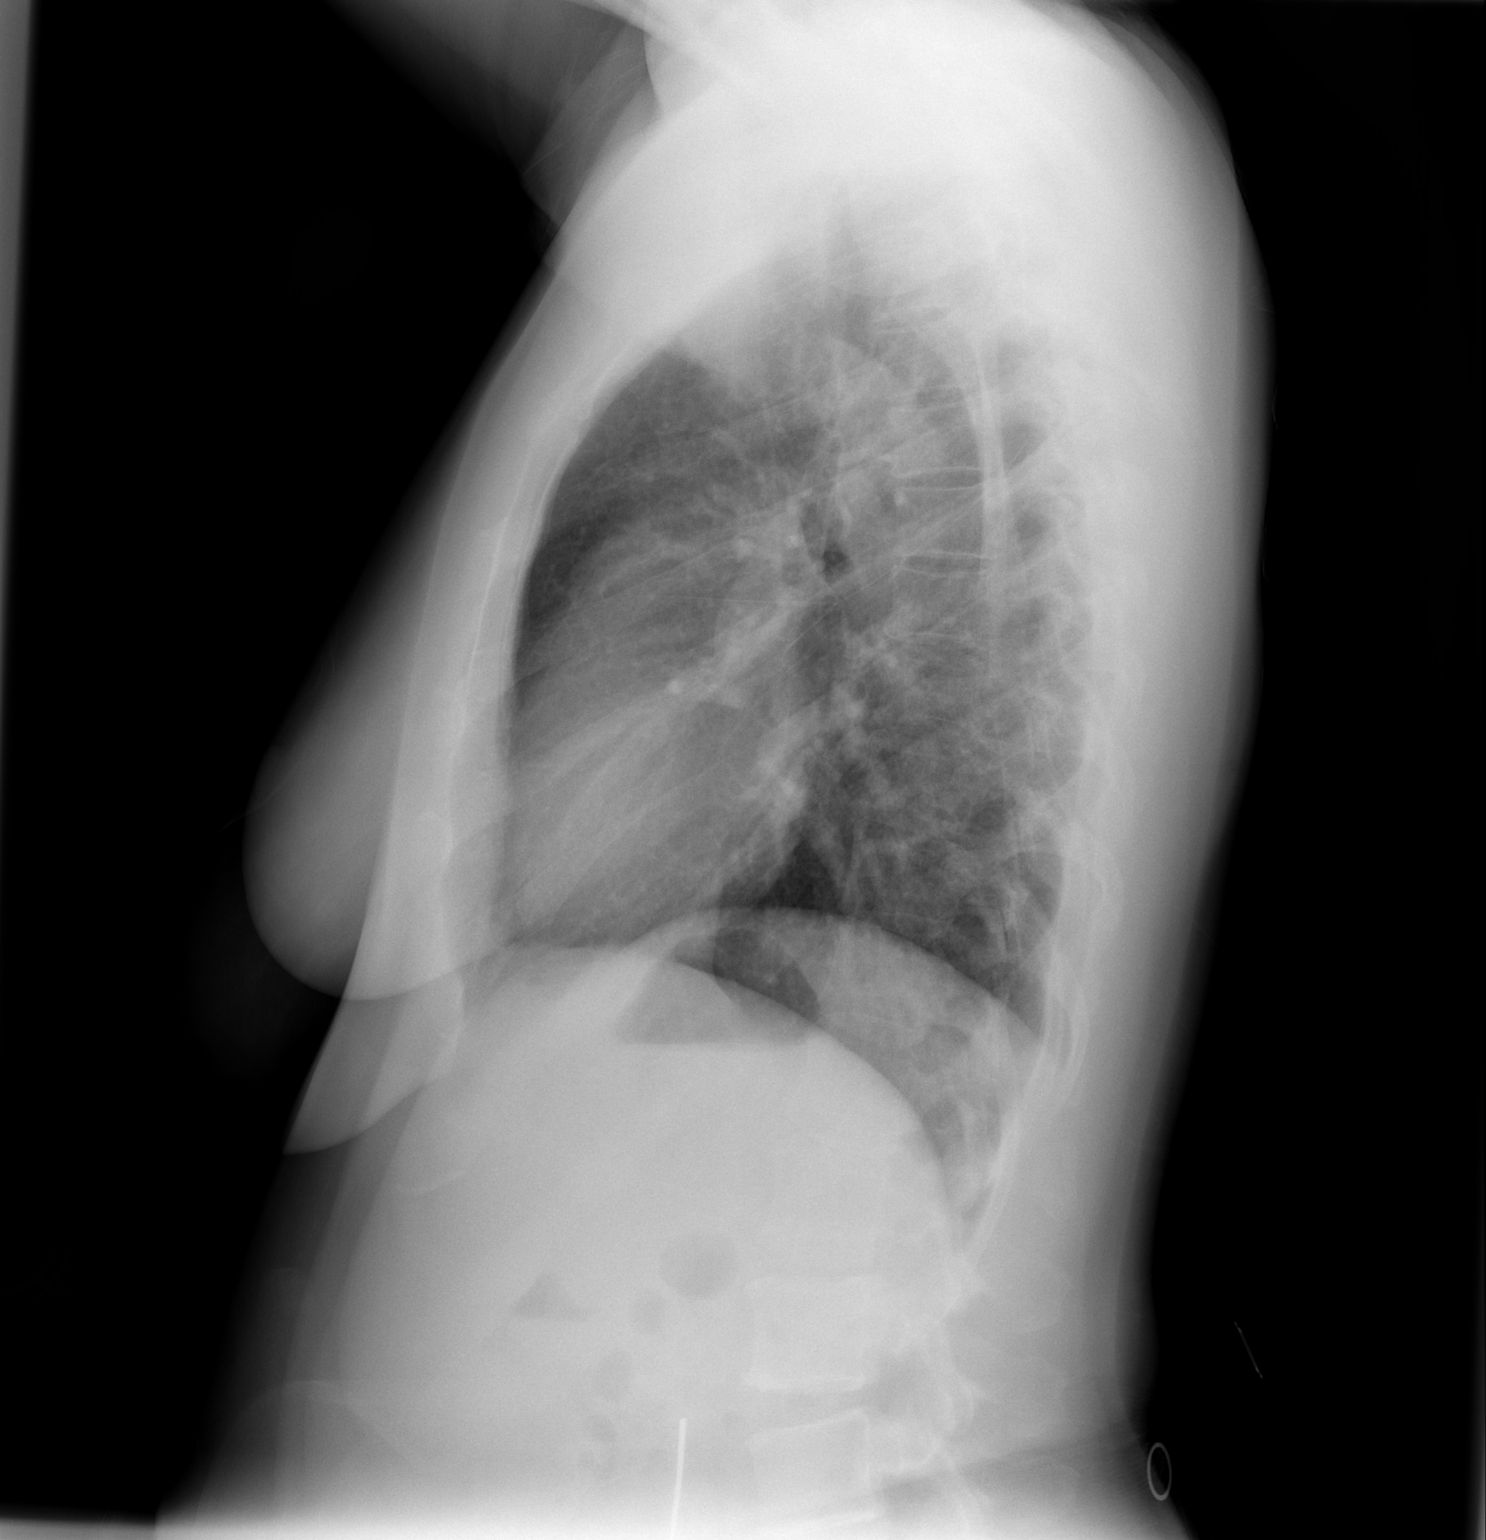

[2 of 2 positions shown; findings below may reference images not displayed]

FINDINGS: The heart size and mediastinal contours are normal. The lungs are
clear. There is no pleural effusion or pneumothorax. No acute
osseous findings are identified.
IMPRESSION: No active cardiopulmonary process.

## 2015-04-07 NOTE — Telephone Encounter (Signed)
-----   Message from Josalyn Funches, MD sent at 03/10/2015  8:56 AM EST ----- All labs normal 

## 2015-04-07 NOTE — Telephone Encounter (Signed)
Date of birth verified by pt  Normal lab results given  Pt verbalized understanding  Information given in Spanish

## 2015-05-06 ENCOUNTER — Encounter (HOSPITAL_COMMUNITY): Payer: Self-pay | Admitting: Emergency Medicine

## 2015-05-06 ENCOUNTER — Emergency Department (INDEPENDENT_AMBULATORY_CARE_PROVIDER_SITE_OTHER)
Admission: EM | Admit: 2015-05-06 | Discharge: 2015-05-06 | Disposition: A | Discharge status: Home / Self Care | Payer: Self-pay | Source: Home / Self Care | Attending: Family Medicine | Admitting: Family Medicine

## 2015-05-06 DIAGNOSIS — J3489 Other specified disorders of nose and nasal sinuses: Secondary | ICD-10-CM

## 2015-05-06 DIAGNOSIS — H6982 Other specified disorders of Eustachian tube, left ear: Secondary | ICD-10-CM

## 2015-05-06 DIAGNOSIS — T700XXA Otitic barotrauma, initial encounter: Secondary | ICD-10-CM

## 2015-05-06 NOTE — ED Provider Notes (Signed)
CSN: [id number]     Arrival date & time 05/06/15  1314 History   First MD Initiated Contact with Patient 05/06/15 1513     No chief complaint on file.  (Consider location/radiation/quality/duration/timing/severity/associated sxs/prior Treatment) HPI Comments: 40 year old Hispanic female who speaks broken English is complaining of mild left ear pain and feeling stopped up since chest today. She states her hearing has decreased only a little. Denies fever or chills. She does have minor sore throat. She is currently clearing her throat during the exam.   Past Medical History  Diagnosis Date  . No pertinent past medical history   . Anemia   . UTI (urinary tract infection)   . Kidney stones   . Mental disorder     depression   Past Surgical History  Procedure Laterality Date  . No past surgeries     Family History  Problem Relation Age of Onset  . Cancer Father    Social History  Substance Use Topics  . Smoking status: Never Smoker   . Smokeless tobacco: Not on file  . Alcohol Use: No     Comment: ETOH with pregnancy   OB History    Gravida Para Term Preterm AB TAB SAB Ectopic Multiple Living   3 3 3       3      Review of Systems  Constitutional: Negative.  Negative for fever.  HENT: Positive for ear pain and rhinorrhea. Negative for ear discharge, facial swelling, nosebleeds, postnasal drip and sore throat.   Eyes: Positive for itching.  Respiratory: Negative for cough, shortness of breath and wheezing.   Cardiovascular: Negative for chest pain and leg swelling.  Gastrointestinal: Negative.   Musculoskeletal: Negative.   Neurological: Negative.     Allergies  Review of patient's allergies indicates no known allergies.  Home Medications   Prior to Admission medications   Medication Sig Start Date End Date Taking? Authorizing Provider  fluticasone (FLONASE) 50 MCG/ACT nasal spray Place 2 sprays into both nostrils daily. 03/09/15   Josalyn Funches, MD   norethindrone-ethinyl estradiol (BREVICON, 28,) 0.5-35 MG-MCG tablet Take 1 tablet by mouth daily. 03/09/15   Dessa PhiJosalyn Funches, MD   Meds Ordered and Administered this Visit  Medications - No data to display  BP 107/73 mmHg  Pulse 68  Temp(Src) 97.3 F (36.3 C) (Oral)  Resp 14  SpO2 100% No data found.   Physical Exam  Constitutional: She is oriented to person, place, and time. She appears well-developed and well-nourished. No distress.  HENT:  Mouth/Throat: No oropharyngeal exudate.  Left TM is moderately retracted with minor injection. Right TM with minor retraction. No erythema. No apparent swelling, moisture or drainage from either EAC. Oropharynx with moderate clear PND and minor erythema and cobblestoning.  Eyes: Conjunctivae and EOM are normal.  Neck: Normal range of motion.  Cardiovascular: Normal rate, regular rhythm and normal heart sounds.   Pulmonary/Chest: Effort normal. No respiratory distress.  Neurological: She is alert and oriented to person, place, and time.  Skin: Skin is warm and dry.  Psychiatric: She has a normal mood and affect.  Nursing note and vitals reviewed.   ED Course  Procedures (including critical care time)  Labs Review Labs Reviewed - No data to display  Imaging Review No results found.   Visual Acuity Review  Right Eye Distance:   Left Eye Distance:   Bilateral Distance:    Right Eye Near:   Left Eye Near:    Bilateral Near:  MDM   1. ETD (eustachian tube dysfunction), left   2. Barotitis media, initial encounter   3. Sinus drainage    the patient called her son who speaks English fluently after the diagnosis was made and we discussed the findings and plan of management. He interpreted this for her. Both patient and her son were given full instructions on the diagnoses care and management and medications by the provider. For drainage you may take Allegra or Zyrtec or Claritin daily. To help with decongestion take  Sudafed PE 10 mg daily. You do not need to use ear drops. You may also take ibuprofen every 6 hours as needed for discomfort. Drink plenty fluids and stay well-hydrated Cepacol lozenges for sore throat.     Hayden Rasmussen, NP 05/06/15 [phone]

## 2015-05-06 NOTE — Discharge Instructions (Signed)
Barotitis media (Barotitis Media) For drainage you may take Allegra or Zyrtec or Claritin daily. To help with decongestion take Sudafed PE 10 mg daily. You do not need to use ear drops. You may also take ibuprofen every 6 hours as needed for discomfort. Drink plenty fluids and stay well-hydrated Cepacol lozenges for sore throat. La barotitis media es la inflamacin del odo medio. Se produce cuando el conducto auditivo (trompa de [place]) que une la parte posterior de la nariz (nasofaringe) con el tmpano, se obstruye. Esta obstruccin puede ser causada por un resfro, alergias ambientales o una infeccin en las vas respiratorias superiores. La barotitis media que no se cura puede llevar a una lesin o a la prdida auditiva barotrauma), que Sales executivepodra llegar a ser [employer] Nextel Corporationpermanente. INSTRUCCIONES PARA EL CUIDADO EN EL HOGAR   Tome todos los medicamentos como le indic el mdico. Los medicamentos de venta libre podrn ayudar a Advertising account plannerquitar la obstruccin del canal y a Air traffic controllerfavorecer el pasaje de Soil scientistaire.  No se introduzca nada en el odo para limpiarlo o destaparlo. Las gotas ticas no lo ayudarn.  No practique natacin ni buceo ni viaje en avin hasta que su mdico le diga que puede Moose Runhacerlo. Si realizar estas actividades fueran necesario, puede ser til la goma de Clarencemascar, que lo har tragar con frecuencia. Tambin lo ayudar si se oprime la Darene Lamernariz y sopla suavemente hasta destaparse los odos para equilibrar los cambios de presin. Esto fuerza el aire en las trompas de [place].  Slo tome medicamentos de venta libre o recetados para Primary school teachercalmar el dolor, Environmental health practitionerel malestar o bajar la Dunlapfiebre, segn las indicaciones de su mdico.  Un descongestivo puede ayudarlo a Education administratordescongestionar el odo medio y Radio producerhacer ms fcil el equilibrio de la presin. SOLICITE ATENCIN MDICA SI:  Experimenta alguna forma de mareo grave, en el que siente como si la habitacin le diera vueltas y tiene nuseas (vrtigo).  Los sntomas slo involucran un  odo. SOLICITE ATENCIN MDICA DE INMEDIATO SI:   Siente un fuerte dolor de cabeza, mareos o dolor intenso en el odo.  Tiene un drenaje sanguinolento o similar a pus por el odo.  Tiene fiebre.  Los sntomas no mejoran o empeoran. ASEGRESE DE QUE:   Comprende estas instrucciones.  Controlar su afeccin.  Recibir ayuda de inmediato si no mejora o si empeora.   Esta informacin no tiene Theme park managercomo fin reemplazar el consejo del mdico. Asegrese de hacerle al mdico cualquier pregunta que tenga.   Document Released: 04/17/2005 Document Revised: 04/22/2013 Elsevier Interactive Patient Education 2016 [employer]  Infeccin del tracto respiratorio superior, adultos (Upper Respiratory Infection, Adult) La mayora de las infecciones del tracto respiratorio superior estn causadas por un virus. Un infeccin del tracto respiratorio superior afecta la nariz, la garganta y las vas respiratorias superiores. El tipo ms comn de infeccin del tracto respiratorio superior es el resfro comn. CUIDADOS EN EL HOGAR   Tome los medicamentos solamente como se lo haya indicado el mdico.  A fin de Engineer, materialsaliviar el dolor de garganta, haga grgaras con solucin salina templada o consuma caramelos para la tos, como se lo haya indicado el mdico.  Use un humidificador de vapor clido o inhale el vapor de la ducha para aumentar la humedad del aire. Esto facilitar la respiracin.  Beba suficiente lquido para mantener el pis (orina) claro o de color amarillo plido.  Tome sopas y caldos transparentes.  Siga una dieta saludable.  Descanse todo lo que sea necesario.  Regrese al Aleen Campitrabajo cuando la fiebre haya  desaparecido o el mdico le diga que puede [place].  Es posible que deba quedarse en su casa durante un tiempo prolongado, para no transmitir la infeccin a los dems.  Tambin puede usar un barbijo y lavarse las manos con frecuencia para evitar el contagio del virus.  Si tiene asma, use el  inhalador con mayor frecuencia.  No consuma ningn producto que contenga tabaco, lo que incluye cigarrillos, tabaco de Theatre manager o Administrator, Civil Service. Si necesita ayuda para dejar de fumar, consulte al mdico. SOLICITE AYUDA SI:  Siente que empeora o que no mejora.  Los medicamentos no logran [employer].  Tiene escalofros.  La dificultad para Clinical cytogeneticist.  Tiene mucosidad marrn o roja.  Tiene una secrecin amarilla o marrn de la Clinical cytogeneticist.  Le duele la cara, especialmente al inclinarse hacia adelante.  Tiene fiebre.  Tiene los ganglios del cuello hinchados.  Siente dolor al tragar.  Tiene zonas blancas en la parte de atrs de la garganta. SOLICITE AYUDA DE INMEDIATO SI:   Los siguientes sntomas son muy intensos o constantes:  Dolor de [place].  Dolor de odos.  Dolor en la frente, detrs de los ojos y por encima de los pmulos (dolor sinusal).  Dolor en el pecho.  Tiene enfermedad pulmonar prolongada (crnica) y cualquiera de estos sntomas:  Sibilancias.  Tos prolongada.  Tos con sangre.  Cambio en la mucosidad habitual.  Presenta rigidez en el cuello.  Tiene cambios en:  La visin.  La audicin.  El pensamiento.  El [place] de nimo. ASEGRESE DE QUE:   Comprende estas instrucciones.  Controlar su afeccin.  Recibir ayuda de inmediato si no mejora o si empeora.   Esta informacin no tiene Theme park manager el consejo del mdico. Asegrese de hacerle al mdico cualquier pregunta que tenga.   Document Released: 09/19/2010 Document Revised: 09/01/2014 Elsevier Interactive Patient Education [employer]

## 2015-05-06 NOTE — ED Notes (Signed)
Patient complains of ear issue.  Has used debrox.

## 2015-12-15 ENCOUNTER — Ambulatory Visit: Payer: Self-pay | Attending: Internal Medicine

## 2016-01-21 ENCOUNTER — Encounter: Payer: Self-pay | Admitting: Family Medicine

## 2016-01-21 ENCOUNTER — Ambulatory Visit: Payer: Self-pay | Attending: Family Medicine | Admitting: Family Medicine

## 2016-01-21 VITALS — BP 108/72 | HR 71 | Temp 98.4°F | Ht <= 58 in | Wt 134.4 lb

## 2016-01-21 DIAGNOSIS — Z309 Encounter for contraceptive management, unspecified: Secondary | ICD-10-CM

## 2016-01-21 DIAGNOSIS — B354 Tinea corporis: Secondary | ICD-10-CM

## 2016-01-21 DIAGNOSIS — Z30011 Encounter for initial prescription of contraceptive pills: Secondary | ICD-10-CM | POA: Insufficient documentation

## 2016-01-21 DIAGNOSIS — R1032 Left lower quadrant pain: Secondary | ICD-10-CM

## 2016-01-21 DIAGNOSIS — Z0000 Encounter for general adult medical examination without abnormal findings: Secondary | ICD-10-CM | POA: Insufficient documentation

## 2016-01-21 DIAGNOSIS — N926 Irregular menstruation, unspecified: Secondary | ICD-10-CM

## 2016-01-21 DIAGNOSIS — R109 Unspecified abdominal pain: Secondary | ICD-10-CM

## 2016-01-21 DIAGNOSIS — B350 Tinea barbae and tinea capitis: Secondary | ICD-10-CM | POA: Insufficient documentation

## 2016-01-21 HISTORY — DX: Tinea corporis: B35.4

## 2016-01-21 LAB — POCT URINALYSIS DIPSTICK
Bilirubin, UA: NEGATIVE
Glucose, UA: NEGATIVE
Ketones, UA: NEGATIVE
Leukocytes, UA: NEGATIVE
NITRITE UA: NEGATIVE
PH UA: 7
Protein, UA: 30
SPEC GRAV UA: 1.015
UROBILINOGEN UA: 1

## 2016-01-21 LAB — COMPLETE METABOLIC PANEL WITH GFR
ALBUMIN: 4 g/dL (ref 3.6–5.1)
ALK PHOS: 66 U/L (ref 33–115)
ALT: 10 U/L (ref 6–29)
AST: 17 U/L (ref 10–30)
BUN: 12 mg/dL (ref 7–25)
CALCIUM: 9 mg/dL (ref 8.6–10.2)
CHLORIDE: 104 mmol/L (ref 98–110)
CO2: 25 mmol/L (ref 20–31)
CREATININE: 0.67 mg/dL (ref 0.50–1.10)
GFR, Est Non African American: >89 mL/min (ref >=60)
Glucose, Bld: 78 mg/dL (ref 65–99)
Potassium: 3.8 mmol/L (ref 3.5–5.3)
Sodium: 139 mmol/L (ref 135–146)
Total Bilirubin: 0.7 mg/dL (ref 0.2–1.2)
Total Protein: 6.6 g/dL (ref 6.1–8.1)

## 2016-01-21 LAB — HEMOCCULT GUIAC POC 1CARD (OFFICE): FECAL OCCULT BLD: NEGATIVE

## 2016-01-21 LAB — LIPASE: Lipase: 16 U/L (ref 7–60)

## 2016-01-21 LAB — POCT URINE PREGNANCY: Preg Test, Ur: NEGATIVE

## 2016-01-21 MED ORDER — KETOCONAZOLE 2 % EX CREA: 1.0000 "application " | TOPICAL_CREAM | Freq: Every day | CUTANEOUS | 0 refills | Status: DC

## 2016-01-21 MED ORDER — KETOCONAZOLE 2 % EX SHAM: 1.0000 "application " | MEDICATED_SHAMPOO | CUTANEOUS | 0 refills | Status: DC

## 2016-01-21 MED ORDER — NORETHINDRONE-ETH ESTRADIOL 0.5-35 MG-MCG PO TABS: 1.0000 | ORAL_TABLET | Freq: Every day | ORAL | 3 refills | Status: DC

## 2016-01-21 MED FILL — KETOCONAZOLE 2% CREAM: 2 | 20 days supply | Qty: 15 | Fill #0

## 2016-01-21 MED FILL — KETOCONAZOLE 2% SHAMPOO: 2 | 20 days supply | Qty: 120 | Fill #0

## 2016-01-21 MED FILL — NECON 0.5-35-28 TABLET: 0.5-35 | 28 days supply | Qty: 28 | Fill #0

## 2016-01-21 NOTE — Progress Notes (Signed)
Pt is having bumps in her head right around her hairline. Pain in (kidney area) Pain on left side of stomach at night. Pt has been having period twice a month. Pt states she has fungus under her breast. Pt declined flu shot.

## 2016-01-21 NOTE — Progress Notes (Signed)
Subjective:  Patient ID: Colleen Warner, female    DOB: [birth year]  Age: 40 y.o. MRN: [id number]  CC: Annual Exam   HPI Colleen Warner has hx of depression she presents for check up but she has multiple concerns and is not due for repeat pap until 01/2017.   1. Bumps on head: around hairline. She gets red bumps that are itchy and then her hair falls out. This has been going on for 6-7 months.   2. Stomach pain: still on LLQ pain. Also with pain in L flank.  Pain resolved following last OV and returned about 2 months ago. Pain occurs every other day. Pain stays on L side. Pain is cramping sensation. Pain is associated with bloating. She denies associated nausea, emesis, constipation. She sometimes has foul smelling diarrhea associated with pain and bloating. Pain is also associated with increased gas. No blood in stool. Pain is worsened by eating dairy. Drinking vinegar water helps with  gas, pain and bloating.   3. Irregular menses: has menstrual period twice per month. For the past 4 months.  Period last for 3 days. Bleeding is light. There is no associated pelvic pain. She was previously on OCP and has stopped. She would like to restart.   Social History  Substance Use Topics  . Smoking status: Never Smoker  . Smokeless tobacco: Not on file  . Alcohol use No     Comment: ETOH with pregnancy    Outpatient Medications Prior to Visit  Medication Sig Dispense Refill  . carbamide peroxide (DEBROX) 6.5 % otic solution 5 drops 2 (two) times daily.    . fluticasone (FLONASE) 50 MCG/ACT nasal spray Place 2 sprays into both nostrils daily. (Patient not taking: Reported on 01/21/2016) 16 g 6  . norethindrone-ethinyl estradiol (BREVICON, 28,) 0.5-35 MG-MCG tablet Take 1 tablet by mouth daily. (Patient not taking: Reported on 01/21/2016) 1 Package 11   No facility-administered medications prior to visit.     ROS Review of Systems  Constitutional: Negative for chills and  fever.  Eyes: Negative for visual disturbance.  Respiratory: Negative for shortness of breath.   Cardiovascular: Negative for chest pain.  Gastrointestinal: Positive for abdominal pain. Negative for blood in stool.  Genitourinary: Positive for menstrual problem.  Musculoskeletal: Negative for arthralgias and back pain.  Skin: Negative for rash.  Allergic/Immunologic: Negative for immunocompromised state.  Hematological: Negative for adenopathy. Does not bruise/bleed easily.  Psychiatric/Behavioral: Negative for dysphoric mood and suicidal ideas.    Objective:  BP 108/72 (BP Location: Right Arm, Patient Position: Sitting, Cuff Size: Small)   Pulse 71   Temp 98.4 F (36.9 C) (Oral)   Ht 4\' 10"  (1.473 m)   Wt 134 lb 6.4 oz (61 kg)   LMP 12/31/2015   SpO2 98%   BMI 28.09 kg/m   BP/Weight 01/21/2016 05/06/2015 03/09/2015  Systolic BP 108 107 98  Diastolic BP 72 73 64  Wt. (Lbs) 134.4 - 124  BMI 28.09 - 25.92   Wt Readings from Last 3 Encounters:  01/21/16 134 lb 6.4 oz (61 kg)  03/09/15 124 lb (56.2 kg)  11/03/14 120 lb 6.4 oz (54.6 kg)    Physical Exam  Constitutional: She is oriented to person, place, and time. She appears well-developed and well-nourished. No distress.  HENT:  Head: Normocephalic and atraumatic.  Cardiovascular: Normal rate, regular rhythm, normal heart sounds and intact distal pulses.   Pulmonary/Chest: Effort normal and breath sounds normal.  Abdominal: Soft. Bowel sounds are normal. She  exhibits no distension and no mass. There is no tenderness. There is no rebound, no guarding and no CVA tenderness.    Musculoskeletal: She exhibits no edema.  Neurological: She is alert and oriented to person, place, and time.  Skin: Skin is warm and dry. No rash noted.     Psychiatric: She has a normal mood and affect.   UA: large blood (patient on menstrual period) otherwise negative  U preg negative  Heme card: negative  Depression screen [facility] 2/9 01/21/2016  03/09/2015 11/03/2014  Decreased Interest 1 2 1   Down, Depressed, Hopeless 1 2 1   PHQ - 2 Score 2 4 2   Altered sleeping 1 3 0  Tired, decreased energy 2 1 1   Change in appetite 0 1 0  Feeling bad or failure about yourself  0 1 0  Trouble concentrating 1 1 1   Moving slowly or fidgety/restless 0 1 0  Suicidal thoughts 0 0 0  PHQ-9 Score 6 12 4   Difficult doing work/chores - - Not difficult at all   GAD 7 : Generalized Anxiety Score 01/21/2016 03/09/2015  Nervous, Anxious, on Edge 1 1  Control/stop worrying 1 0  Worry too much - different things 2 1  Trouble relaxing 1 0  Restless 1 1  Easily annoyed or irritable 2 1  Afraid - awful might happen - 0  Total GAD 7 Score - 4     Assessment & Plan:   Colleen Warner was seen today for annual exam.  Diagnoses and all orders for this visit:  Irregular menstrual cycle -     POCT urine pregnancy -     US Pelvis Complete; Future -     US Transvaginal Non-OB; Future  Abdominal pain, unspecified abdominal location -     POCT urinalysis dipstick -     COMPLETE METABOLIC PANEL WITH GFR -     Lipase -     US Abdomen Complete; Future -     Hemoccult - 1 Card (office) -     US Pelvis Complete; Future -     US Transvaginal Non-OB; Future  Tinea corporis -     ketoconazole (NIZORAL) 2 % shampoo; Apply 1 application topically 2 (two) times a week. -     ketoconazole (NIZORAL) 2 % cream; Apply 1 application topically daily.    No orders of the defined types were placed in this encounter.   Follow-up: Return in about 4 weeks (around 02/18/2016) for review labs and ultrasounds .   Colleen Phi MD

## 2016-01-21 NOTE — Assessment & Plan Note (Signed)
A: LLQ pain recurrent without fever, chills, weight loss or blood in stools. Suspect lactose intolerance there is likely additional food intolerance. The bump she is concerned about feels like lipoma or fibroma in abdominal wall. Exam is not consistent with hernia.  P: CMP, lipase Abdominal ultrasound

## 2016-01-21 NOTE — Assessment & Plan Note (Signed)
On scalp hairline  and body Plan for nizoral shampoo and cream

## 2016-01-21 NOTE — Assessment & Plan Note (Addendum)
A; irregular menses w/o menorrhagia U preg negative P: Restart OCPs  Pelvic and TVUS

## 2016-01-21 NOTE — Patient Instructions (Signed)
[name] was seen today for annual exam.  Diagnoses and all orders for this visit:  Irregular menstrual cycle -     POCT urine pregnancy -     US Pelvis Complete; Future -     US Transvaginal Non-OB; Future  Abdominal pain, unspecified abdominal location -     POCT urinalysis dipstick -     COMPLETE METABOLIC PANEL WITH GFR -     Lipase -     US Abdomen Complete; Future -     Hemoccult - 1 Card (office) -     US Pelvis Complete; Future -     US Transvaginal Non-OB; Future  Tinea corporis -     ketoconazole (NIZORAL) 2 % shampoo; Apply 1 application topically 2 (two) times a week. -     ketoconazole (NIZORAL) 2 % cream; Apply 1 application topically daily.   F/u in 3-4 weeks to review labs and ultrasounds   Dr. Armen PickupFunches    Recommend dietary changes for possible IBS with diarrhea:  1. Exclude foods that increase flatulence (eg, beans, onions, celery, carrots, raisins, bananas, apricots, prunes, Brussels sprouts, wheat germ, pretzels, and bagels), alcohol, and caffeine.  Have symptoms improved?   If yes, continue to excluded above  If no,  2. Exclude lactose   Have symptoms improved?  If yes, continue to exclude 1 and 2.  If no,  3. Exclude FODMAP Characteristics and sources of common FODMAPs  F Fermentable    O Oligosaccharides Fructans, galacto-oligosaccharides Wheat, barley, rye, onion, leek, white part of spring onion, garlic, shallots, artichokes, beetroot, fennel, peas, chicory, pistachio, cashews, legumes, lentils, and chickpeas  D Disaccharides Lactose Milk, custard, ice cream, and yogurt  M Monosaccharides "Free fructose" (fructose in excess of glucose) Apples, pears, mangoes, cherries, watermelon, asparagus, sugar snap peas, honey, high-fructose corn syrup  A And  P Polyols Sorbitol, mannitol, maltitol, and xylitol Apples, pears, apricots, cherries, nectarines, peaches, plums, watermelon, mushrooms, cauliflower, artificially sweetened chewing gum and  confectionery   Be sure to drink plenty of fluids to keep up with water loses from diarrhea

## 2016-01-22 ENCOUNTER — Telehealth: Payer: Self-pay | Admitting: Family Medicine

## 2016-01-22 NOTE — Telephone Encounter (Signed)
Please call patient All the labs were normal

## 2016-01-28 NOTE — Telephone Encounter (Signed)
Date of birth verified by pt  Lab results given  Pt verbalized understanding  Information given in Spanish  

## 2016-02-02 ENCOUNTER — Ambulatory Visit (HOSPITAL_COMMUNITY)
Admission: RE | Admit: 2016-02-02 | Discharge: 2016-02-02 | Disposition: A | Discharge status: Home / Self Care | Payer: Self-pay | Source: Ambulatory Visit | Attending: Family Medicine | Admitting: Family Medicine

## 2016-02-02 DIAGNOSIS — R109 Unspecified abdominal pain: Secondary | ICD-10-CM | POA: Insufficient documentation

## 2016-02-02 DIAGNOSIS — N83202 Unspecified ovarian cyst, left side: Secondary | ICD-10-CM | POA: Insufficient documentation

## 2016-02-02 DIAGNOSIS — N926 Irregular menstruation, unspecified: Secondary | ICD-10-CM | POA: Insufficient documentation

## 2016-02-02 DIAGNOSIS — K8080 Other cholelithiasis without obstruction: Secondary | ICD-10-CM | POA: Insufficient documentation

## 2016-02-02 DIAGNOSIS — N83201 Unspecified ovarian cyst, right side: Secondary | ICD-10-CM | POA: Insufficient documentation

## 2016-02-09 ENCOUNTER — Telehealth: Payer: Self-pay | Admitting: Family Medicine

## 2016-02-09 NOTE — Telephone Encounter (Signed)
Pt was called on 10/11, ok for front desk to give results if she calls back. Pelvic and transvaginal ultrasound reveal Small simple cyst in both ovaries  Otherwise normal  Simple cyst are common findings, do tend to cause pain, no further imaging needed       Abdominal ultrasound with gallstones in the gallbladder without evidence of inflammation or blockage  Will need to watch out for R upper abdominal pain or nausea  Otherwise normal abdominal ultrasound without cause of pain

## 2016-02-09 NOTE — Telephone Encounter (Signed)
Patient is calling regarding her Ultrasound from 10/4.   Please follow up with patient.  Interpreter needed when calling patient

## 2016-02-14 ENCOUNTER — Telehealth: Payer: Self-pay | Admitting: Family Medicine

## 2016-02-14 NOTE — Telephone Encounter (Signed)
Patient called the office regarding ultrasound results, I informed pt of it. She went to state that she is experiencing bloat/inflammation on her stomach especially at night. Pt is taking a teaspoon of cider vinegar to help with stomach bloating. Please follow up.  Thank you.

## 2016-02-16 ENCOUNTER — Telehealth: Payer: Self-pay

## 2016-02-16 NOTE — Telephone Encounter (Signed)
[employer] [place]le Id: [id number] contacted pt go over some advice pt is aware and will be mailing letter out

## 2016-02-16 NOTE — Telephone Encounter (Signed)
I recommend the following diet changes to reduce stomach bloating  (you may copy and paste this to a letter to send to patient, she speaks spanish so letter should be translated or ask patient if someone at home speaks [place])   1. Exclude foods that increase flatulence (eg, beans, onions, celery, carrots, raisins, bananas, apricots, prunes, Brussels sprouts, wheat germ, pretzels, and bagels), alcohol, and caffeine.  Have symptoms improved?   If yes, continue to excluded above  If no,  2. Exclude lactose   Have symptoms improved?  If yes, continue to exclude 1 and 2.  If no,  3. Exclude FODMAP Characteristics and sources of common FODMAPs  F Fermentable    O Oligosaccharides Fructans, galacto-oligosaccharides Wheat, barley, rye, onion, leek, white part of spring onion, garlic, shallots, artichokes, beetroot, fennel, peas, chicory, pistachio, cashews, legumes, lentils, and chickpeas  D Disaccharides Lactose Milk, custard, ice cream, and yogurt  M Monosaccharides "Free fructose" (fructose in excess of glucose) Apples, pears, mangoes, cherries, watermelon, asparagus, sugar snap peas, honey, high-fructose corn syrup  A And  P Polyols Sorbitol, mannitol, maltitol, and xylitol Apples, pears, apricots, cherries, nectarines, peaches, plums, watermelon, mushrooms, cauliflower, artificially sweetened chewing gum and confectionery

## 2016-02-23 NOTE — Telephone Encounter (Signed)
Pt. Called requesting to speak with her nurse regarding her ultrasound results. Please f/u with pt.

## 2016-06-27 ENCOUNTER — Other Ambulatory Visit: Payer: Self-pay | Admitting: Family Medicine

## 2016-06-27 DIAGNOSIS — Z1231 Encounter for screening mammogram for malignant neoplasm of breast: Secondary | ICD-10-CM

## 2016-07-13 ENCOUNTER — Ambulatory Visit
Admission: RE | Admit: 2016-07-13 | Discharge: 2016-07-13 | Disposition: A | Discharge status: Home / Self Care | Payer: No Typology Code available for payment source | Source: Ambulatory Visit | Attending: Family Medicine | Admitting: Family Medicine

## 2016-07-13 DIAGNOSIS — Z1231 Encounter for screening mammogram for malignant neoplasm of breast: Secondary | ICD-10-CM

## 2016-08-21 ENCOUNTER — Ambulatory Visit: Payer: Self-pay | Attending: Family Medicine

## 2016-08-21 MED FILL — NORTREL 0.5-35 TABLET: 0.5-35 | 28 days supply | Qty: 28 | Fill #1

## 2016-09-13 ENCOUNTER — Encounter: Payer: Self-pay | Admitting: Family Medicine

## 2016-09-14 MED FILL — NORTREL 0.5-35 TABLET: 0.5-35 | 28 days supply | Qty: 28 | Fill #2

## 2016-09-15 ENCOUNTER — Encounter: Payer: Self-pay | Admitting: Family Medicine

## 2016-09-15 ENCOUNTER — Ambulatory Visit: Payer: Self-pay | Attending: Family Medicine | Admitting: Family Medicine

## 2016-09-15 VITALS — BP 113/80 | HR 69 | Temp 98.0°F | Wt 139.0 lb

## 2016-09-15 DIAGNOSIS — R350 Frequency of micturition: Secondary | ICD-10-CM | POA: Insufficient documentation

## 2016-09-15 DIAGNOSIS — N83202 Unspecified ovarian cyst, left side: Secondary | ICD-10-CM | POA: Insufficient documentation

## 2016-09-15 DIAGNOSIS — Z114 Encounter for screening for human immunodeficiency virus [HIV]: Secondary | ICD-10-CM | POA: Insufficient documentation

## 2016-09-15 DIAGNOSIS — Z79899 Other long term (current) drug therapy: Secondary | ICD-10-CM | POA: Insufficient documentation

## 2016-09-15 DIAGNOSIS — N83201 Unspecified ovarian cyst, right side: Secondary | ICD-10-CM | POA: Insufficient documentation

## 2016-09-15 DIAGNOSIS — Z13220 Encounter for screening for lipoid disorders: Secondary | ICD-10-CM

## 2016-09-15 DIAGNOSIS — R3129 Other microscopic hematuria: Secondary | ICD-10-CM | POA: Insufficient documentation

## 2016-09-15 DIAGNOSIS — Z124 Encounter for screening for malignant neoplasm of cervix: Secondary | ICD-10-CM | POA: Insufficient documentation

## 2016-09-15 LAB — POCT URINALYSIS DIPSTICK
BILIRUBIN UA: NEGATIVE
GLUCOSE UA: NEGATIVE
Ketones, UA: NEGATIVE
LEUKOCYTES UA: NEGATIVE
NITRITE UA: NEGATIVE
Protein, UA: NEGATIVE
Spec Grav, UA: 1.02 (ref 1.010–1.025)
UROBILINOGEN UA: 1 U/dL
pH, UA: 7 (ref 5.0–8.0)

## 2016-09-15 NOTE — Progress Notes (Signed)
Subjective:  Patient ID: Colleen MouldJulissa Archaga-Umanzor, female    DOB: [birth year]  Age: 41 y.o. MRN: [id number]  CC: Gynecologic Exam and Urinary Frequency spanish interpreter Nettie ElmSylvia ID # [id number]   HPI [name] Archaga-Umanzor presents for   1. Gynecology exam: she request screening pap smear. She denies vaginal discharge and bleeding. She reports some LLQ pains. She had pelvic and transvaginal ultrasound 02/02/2016: IMPRESSION: 1. The uterus and endometrium are normal in appearance. 2. Simple appearing cysts in both ovaries measuring up to 2.3 cm on the right and 1.3 cm on the left.   2. Urinary frequency: last for one day. Started one week ago. She reports having to hold her urine at work for several hours. She denies dysuria.   Social History  Substance Use Topics  . Smoking status: Never Smoker  . Smokeless tobacco: Never Used  . Alcohol use No     Comment: ETOH with pregnancy     Outpatient Medications Prior to Visit  Medication Sig Dispense Refill  . carbamide peroxide (DEBROX) 6.5 % otic solution 5 drops 2 (two) times daily.    . fluticasone (FLONASE) 50 MCG/ACT nasal spray Place 2 sprays into both nostrils daily. (Patient not taking: Reported on 01/21/2016) 16 g 6  . ketoconazole (NIZORAL) 2 % cream Apply 1 application topically daily. 15 g 0  . ketoconazole (NIZORAL) 2 % shampoo Apply 1 application topically 2 (two) times a week. 120 mL 0  . norethindrone-ethinyl estradiol (BREVICON, 28,) 0.5-35 MG-MCG tablet Take 1 tablet by mouth daily. 3 Package 3   No facility-administered medications prior to visit.     ROS Review of Systems  Constitutional: Negative for chills and fever.  Eyes: Negative for visual disturbance.  Respiratory: Negative for shortness of breath.   Cardiovascular: Negative for chest pain.  Gastrointestinal: Negative for abdominal pain and blood in stool.  Genitourinary: Positive for frequency and pelvic pain.  Musculoskeletal: Negative for arthralgias  and back pain.  Skin: Negative for rash.  Allergic/Immunologic: Negative for immunocompromised state.  Hematological: Negative for adenopathy. Does not bruise/bleed easily.  Psychiatric/Behavioral: Negative for dysphoric mood and suicidal ideas.    Objective:  BP 113/80   Pulse 69   Temp 98 F (36.7 C) (Oral)   Wt 139 lb (63 kg)   LMP 09/10/2016   SpO2 98%   BMI 29.05 kg/m   BP/Weight 09/15/2016 01/21/2016 05/06/2015  Systolic BP 113 108 107  Diastolic BP 80 72 73  Wt. (Lbs) 139 134.4 -  BMI 29.05 28.09 -   Physical Exam  Constitutional: She appears well-developed and well-nourished. No distress.  Cardiovascular: Normal rate, regular rhythm, normal heart sounds and intact distal pulses.   Pulmonary/Chest: Effort normal and breath sounds normal.  Genitourinary: Vagina normal and uterus normal. Pelvic exam was performed with patient prone. There is no rash, tenderness or lesion on the right labia. There is no rash, tenderness or lesion on the left labia. Cervix exhibits no motion tenderness, no discharge and no friability. Right adnexum displays no mass, no tenderness and no fullness. Left adnexum displays tenderness. Left adnexum displays no mass and no fullness.  Musculoskeletal: She exhibits no edema.  Lymphadenopathy:       Right: No inguinal adenopathy present.       Left: No inguinal adenopathy present.  Skin: Skin is warm and dry. No rash noted.   UA: trace-lysed RBC otherwise normal   Lab Results  Component Value Date   HGBA1C 5.10 03/09/2015  Assessment & Plan:  Debborah was seen today for gynecologic exam and urinary frequency.  Diagnoses and all orders for this visit:  Encounter for screening for HIV -     HIV antibody (with reflex)  Lipid screening -     Lipid Panel  Pap smear for cervical cancer screening -     Cytology - PAP  Urinary frequency -     POCT urinalysis dipstick  Hematuria, microscopic -     Urinalysis, microscopic only -     Urine  culture   There are no diagnoses linked to this encounter.  No orders of the defined types were placed in this encounter.   Follow-up: Return in about 6 months (around 03/18/2017) for check in .   Dessa Phi MD

## 2016-09-15 NOTE — Patient Instructions (Addendum)
Diagnoses and all orders for this visit:  Encounter for screening for HIV -     HIV antibody (with reflex)  Lipid screening -     Lipid Panel  Pap smear for cervical cancer screening -     Cytology - PAP  Urinary frequency -     POCT urinalysis dipstick   You will be called with pap and labs results  F/u in   Dr. Armen PickupFunches

## 2016-09-16 LAB — LIPID PANEL
CHOL/HDL RATIO: 3.7 ratio (ref 0.0–4.4)
Cholesterol, Total: 216 mg/dL — ABNORMAL HIGH (ref 100–199)
HDL: 58 mg/dL (ref >39)
LDL CALC: 109 mg/dL — AB (ref 0–99)
TRIGLYCERIDES: 244 mg/dL — AB (ref 0–149)
VLDL CHOLESTEROL CAL: 49 mg/dL — AB (ref 5–40)

## 2016-09-16 LAB — HIV ANTIBODY (ROUTINE TESTING W REFLEX): HIV Screen 4th Generation wRfx: NONREACTIVE

## 2016-09-17 DIAGNOSIS — R3129 Other microscopic hematuria: Secondary | ICD-10-CM | POA: Insufficient documentation

## 2016-09-17 NOTE — Assessment & Plan Note (Signed)
Trace lysed RBC on UA Urine culture or microscopy ordered

## 2016-09-19 LAB — CYTOLOGY - PAP
Chlamydia: NEGATIVE
Diagnosis: NEGATIVE
HPV (WINDOPATH): NOT DETECTED
Neisseria Gonorrhea: NEGATIVE

## 2016-09-19 LAB — CERVICOVAGINAL ANCILLARY ONLY: WET PREP (BD AFFIRM): POSITIVE — AB

## 2016-10-02 ENCOUNTER — Other Ambulatory Visit: Payer: Self-pay

## 2016-10-02 ENCOUNTER — Telehealth: Payer: Self-pay | Admitting: Family Medicine

## 2016-10-02 DIAGNOSIS — B9689 Other specified bacterial agents as the cause of diseases classified elsewhere: Secondary | ICD-10-CM

## 2016-10-02 DIAGNOSIS — N760 Acute vaginitis: Principal | ICD-10-CM

## 2016-10-02 MED ORDER — METRONIDAZOLE 0.75 % VA GEL: 1.0000 | Freq: Every day | VAGINAL | 0 refills | Status: DC

## 2016-10-02 NOTE — Telephone Encounter (Signed)
Patient called requesting results, please f/up °

## 2016-10-02 NOTE — Telephone Encounter (Signed)
Pt was called and informed of lab results. 

## 2016-10-03 MED FILL — VANDAZOLE VAGINAL 0.75% GEL: 0.75 | 5 days supply | Qty: 70 | Fill #0

## 2016-10-04 ENCOUNTER — Telehealth: Payer: Self-pay | Admitting: Family Medicine

## 2016-10-04 NOTE — Telephone Encounter (Signed)
Pt came in stating that when she was called with lab results she did not get a full understanding and would like the nurse to call back. Please f/u.

## 2016-10-12 MED FILL — NORTREL 0.5-35 TABLET: 0.5-35 | 28 days supply | Qty: 28 | Fill #3

## 2016-11-09 MED FILL — NORTREL 0.5-35 TABLET: 0.5-35 | 28 days supply | Qty: 28 | Fill #4

## 2016-12-07 MED FILL — NORTREL 0.5-35 TABLET: 0.5-35 | 28 days supply | Qty: 28 | Fill #5

## 2017-01-04 MED FILL — NORTREL 0.5-35 TABLET: 0.5-35 | 28 days supply | Qty: 28 | Fill #6

## 2017-01-29 ENCOUNTER — Other Ambulatory Visit: Payer: Self-pay | Admitting: Family Medicine

## 2017-01-29 DIAGNOSIS — N926 Irregular menstruation, unspecified: Secondary | ICD-10-CM

## 2017-01-29 DIAGNOSIS — Z30011 Encounter for initial prescription of contraceptive pills: Secondary | ICD-10-CM

## 2017-01-29 MED FILL — NORTREL 0.5-35 TABLET: 0.5-35 | 28 days supply | Qty: 28 | Fill #0

## 2017-01-29 NOTE — Telephone Encounter (Signed)
Patient called requesting medication refill on norethindrone-ethinyl estradiol (BREVICON, 28,) 0.5-35 MG-MCG tablet Please f/up

## 2017-02-07 ENCOUNTER — Ambulatory Visit: Payer: Self-pay | Attending: Internal Medicine

## 2017-03-07 ENCOUNTER — Other Ambulatory Visit: Payer: Self-pay | Admitting: Family Medicine

## 2017-03-07 ENCOUNTER — Ambulatory Visit: Payer: Self-pay | Attending: Family Medicine | Admitting: Family Medicine

## 2017-03-07 ENCOUNTER — Ambulatory Visit (HOSPITAL_COMMUNITY)
Admission: RE | Admit: 2017-03-07 | Discharge: 2017-03-07 | Disposition: A | Discharge status: Home / Self Care | Payer: Self-pay | Source: Ambulatory Visit | Attending: Family Medicine | Admitting: Family Medicine

## 2017-03-07 ENCOUNTER — Encounter: Payer: Self-pay | Admitting: Family Medicine

## 2017-03-07 VITALS — BP 100/69 | HR 88 | Temp 98.7°F | Resp 18 | Ht <= 58 in | Wt 139.6 lb

## 2017-03-07 DIAGNOSIS — N200 Calculus of kidney: Secondary | ICD-10-CM | POA: Insufficient documentation

## 2017-03-07 DIAGNOSIS — R109 Unspecified abdominal pain: Secondary | ICD-10-CM | POA: Insufficient documentation

## 2017-03-07 DIAGNOSIS — Z309 Encounter for contraceptive management, unspecified: Secondary | ICD-10-CM | POA: Insufficient documentation

## 2017-03-07 DIAGNOSIS — Z760 Encounter for issue of repeat prescription: Secondary | ICD-10-CM

## 2017-03-07 DIAGNOSIS — K649 Unspecified hemorrhoids: Secondary | ICD-10-CM | POA: Insufficient documentation

## 2017-03-07 DIAGNOSIS — Z87442 Personal history of urinary calculi: Secondary | ICD-10-CM | POA: Insufficient documentation

## 2017-03-07 DIAGNOSIS — M549 Dorsalgia, unspecified: Secondary | ICD-10-CM | POA: Insufficient documentation

## 2017-03-07 DIAGNOSIS — K8020 Calculus of gallbladder without cholecystitis without obstruction: Secondary | ICD-10-CM | POA: Insufficient documentation

## 2017-03-07 LAB — HEMOCCULT GUIAC POC 1CARD (OFFICE): Fecal Occult Blood, POC: NEGATIVE

## 2017-03-07 LAB — POCT URINALYSIS DIPSTICK
BILIRUBIN UA: NEGATIVE
Glucose, UA: NEGATIVE
KETONES UA: NEGATIVE
LEUKOCYTES UA: NEGATIVE
Nitrite, UA: NEGATIVE
PROTEIN UA: NEGATIVE
Spec Grav, UA: 1.01 (ref 1.010–1.025)
Urobilinogen, UA: 0.2 E.U./dL
pH, UA: 6 (ref 5.0–8.0)

## 2017-03-07 LAB — POCT URINE PREGNANCY: PREG TEST UR: NEGATIVE

## 2017-03-07 MED ORDER — HYDROCORTISONE 2.5 % RE CREA: 1.0000 "application " | TOPICAL_CREAM | Freq: Two times a day (BID) | RECTAL | 1 refills | Status: DC | PRN

## 2017-03-07 MED ORDER — POLYETHYLENE GLYCOL 3350 17 GM/SCOOP PO POWD: 17.0000 g | Freq: Every day | ORAL | 1 refills | Status: DC | PRN

## 2017-03-07 MED ORDER — NORETHINDRONE-ETH ESTRADIOL 0.5-35 MG-MCG PO TABS: 1.0000 | ORAL_TABLET | Freq: Every day | ORAL | 11 refills | Status: DC

## 2017-03-07 MED FILL — NORTREL 0.5-35 TABLET: 0.5-35 | 28 days supply | Qty: 28 | Fill #0

## 2017-03-07 MED FILL — HYDROCORTISONE 2.5% CREAM: 2.5 | 7 days supply | Qty: 30 | Fill #0

## 2017-03-07 MED FILL — POLYETHYLENE GLYCOL 3350: 15 days supply | Qty: 255 | Fill #0

## 2017-03-07 NOTE — Progress Notes (Signed)
Patient is here for back side left kidney side pain   Patient also stated that she has a bump on rectum area hemorrhoids

## 2017-03-07 NOTE — Patient Instructions (Signed)
Hemorroides (Hemorrhoids) Las hemorroides son venas inflamadas adentro o alrededor del recto o del ano. Hay dos tipos de hemorroides:  Hemorroides internas. Se forman en las venas del interior del recto. Pueden abultarse hacia afuera, irritarse y doler.  Hemorroides externas. Se producen en las venas externas del ano y pueden sentirse como un bulto o zona hinchada, dura y dolorosa cerca del ano. [place] hemorroides no causan problemas graves y se Engineer, petroleum con tratamientos caseros [employer] cambios en la dieta y el estilo de vida. Si los tratamientos caseros no ayudan con los sntomas, se pueden Optometrist procedimientos para reducir o extirpar las hemorroides. CAUSAS La causa de esta afeccin es el aumento de la presin en la zona anal. Esta presin puede ser causada por distintos factores, por ejemplo:  Estreimiento.  Dificultad para defecar.  Diarrea.  Embarazo.  Obesidad.  Estar sentado durante largos perodos.  Levantar objetos pesados u otras actividades que impliquen esfuerzo.  Sexo anal. SNTOMAS Los sntomas de esta afeccin incluyen lo siguiente:  Dolor.  Picazn o irritacin anal.  Sangrado rectal.  Prdida de materia fecal (heces).  Inflamacin anal.  Uno o ms bultos alrededor del ano. DIAGNSTICO Esta afeccin se diagnostica frecuentemente a travs de un examen visual. Posiblemente le realicen otros tipos de pruebas o estudios, como los siguientes:  Examen de la zona rectal con una mano enguantada (examen digital rectal).  Examen del canal anal utilizando un pequeo tubo (anoscopio).  Anlisis de sangre si ha perdido [place] cantidad significativa de [place].  Un estudio para observar el interior del colon (sigmoidoscopia o colonoscopia). TRATAMIENTO Esta afeccin generalmente se puede tratar en el hogar. Se pueden realizar diversos procedimientos si los cambios en la dieta, en el estilo de vida y otros tratamientos caseros no [employer]. Estos procedimientos pueden ayudar a reducir o [place] hemorroides completamente. Algunos de estos procedimientos son quirrgicos y otros no. Algunos de los procedimientos ms frecuentes son los siguientes:  Ligadura con Forensic psychologist. Las bandas elsticas se colocan en la base de las hemorroides para interrumpir la irrigacin de [place].  Escleroterapia. Se inyecta un medicamento en las hemorroides para reducir su tamao.  Coagulacin con luz infrarroja. Se utiliza un tipo de energa lumnica para eliminar las hemorroides.  Hemorroidectoma. Las hemorroides se extirpan con [place] y las venas que las [place] se IT consultant.  Hemorroidopexia con grapas. Se [place] un dispositivo tipo grapa de forma circular para extirpar las hemorroides y unas grapas para cortar la sangre que se irriga hacia las hemorroides. INSTRUCCIONES PARA EL CUIDADO EN EL HOGAR Comida y bebida  Consuma alimentos con alto contenido de [place], como cereales integrales, porotos, frutos secos, frutas y verduras. Pregntele a su mdico acerca de tomar productos con fibra aadida en ellos (complementos de fibra).  Beba suficiente lquido para Consulting civil engineer orina clara o de color amarillo plido. Control del dolor y la hinchazn  Tome baos de asiento tibios durante 20 minutos, 3 o 4 veces por da para Glass blower/designer y las [place].  Si se lo indican, aplique hielo en la zona afectada. Usar compresas de [employer] baos de asiento puede ser [place]. ? Ponga el hielo en una bolsa plstica. ? Coloque una [employer] piel y la bolsa de hielo. ? Coloque el hielo durante 20 minutos, 2 a 3 veces por da. Instrucciones generales  [employer] de venta libre y los recetados solamente como se lo haya indicado el mdico.  Aplquese los  medicamentos, cremas o supositorios como se lo hayan indicado.  Haga ejercicios regularmente.  Vaya al bao cuando sienta la necesidad de defecar. No espere.  Evite hacer  fuerza al defecar.  Mantenga la zona anal limpia y seca. Use papel higinico hmedo o toallitas humedecidas despus de defecar.  No pase mucho tiempo sentado en el inodoro. Esto aumenta la afluencia de sangre y Chief Technology Officerel dolor. SOLICITE ATENCIN MDICA SI:  Aumenta el dolor y la hinchazn, y no puede controlarlos con los medicamentos o con Pharmacist, communityun tratamiento.  Tiene una hemorragia que no Magazine features editorpuede controlar.  No puede defecar o lo hace con dificultad.  Siente dolor o tiene inflamacin fuera de la zona de las hemorroides.  Esta informacin no tiene Theme park managercomo fin reemplazar el consejo del mdico. Asegrese de hacerle al mdico cualquier pregunta que tenga. Document Released: 04/17/2005 Document Revised: 08/09/2015 Document Reviewed: 12/30/2014 Elsevier Interactive Patient Education  2017 Elsevier Inc.   Constipacin en los adultos (Constipation, Adult) Constipacin significa que una persona defeca en una semana menos que lo normal, hay dificultad para defecar, o las heces son secas, duras, o ms grandes que lo normal. La causa puede ser una afeccin subyacente. Puede empeorar con la edad si una persona toma ciertos medicamentos y no toma suficiente lquido. INSTRUCCIONES PARA EL CUIDADO EN EL HOGAR Comida y bebida  Consuma alimentos con alto contenido de [place], como frutas y verduras frescas, cereales integrales y frijoles.  Limite los alimentos ricos en grasas y con bajo contenido de [place], o muy procesados, como las papas fritas, [place]as, [place]villegalletas, dulces y refrescos.  Beba suficiente lquido para Photographermantener la orina clara o de color amarillo plido. Instrucciones generales  Haga actividad fsica habitualmente o como se lo haya indicado el mdico.  Vaya al bao cuando sienta la necesidad de ir. No se aguante las ganas.  Tome los medicamentos de venta libre y los recetados solamente como se lo haya indicado el mdico. Estos incluyen los suplementos de [place]ra.  Practique ejercicios de  rehabilitacin del suelo plvico, como la respiracin profunda mientras relaja la parte inferior del abdomen y relajacin del suelo plvico mientras defeca.  Controle su afeccin para ver si hay cambios.  Concurra a todas las visitas de control como se lo haya indicado el mdico. Esto es importante. SOLICITE ATENCIN MDICA SI:  Siente un dolor que empeora.  Tiene fiebre.  No defeca despus de 4das.  Vomita.  No tiene hambre.  Pierde peso.  Tiene una hemorragia en el ano.  Las heces son delgadas como un lpiz.  SOLICITE ATENCIN MDICA DE INMEDIATO SI:  Tiene fiebre y los sntomas empeoran repentinamente.  Observa que se filtran heces o hay sangre en las heces.  Tiene el abdomen hinchado.  Siente un dolor intenso en el abdomen.  Se siente mareado o se desmaya.  Esta informacin no tiene Theme park managercomo fin reemplazar el consejo del mdico. Asegrese de hacerle al mdico cualquier pregunta que tenga. Document Released: 05/07/2007 Document Revised: 05/08/2014 Document Reviewed: 10/06/2015 Elsevier Interactive Patient Education  2017 [employer]vier Inc.

## 2017-03-07 NOTE — Progress Notes (Unsigned)
u

## 2017-03-07 NOTE — Progress Notes (Signed)
   Subjective:  Patient ID: Colleen MouldJulissa Archaga-Umanzor, female    DOB: 01/07/1976  Age: 41 y.o. MRN: [id number]  CC: Back Pain   HPI Trinitee Archaga-Umanzor presents leftt sided back pain: Onset 15 days ago. Associated symptoms weakness, fatigue, . She reports history of right sided kidney stones 12 years ago with lithotripsy performed in the past. She denies any hematuria, dysuria, fever, or vomiting. Rectal lump: She history of hemorrhoids 6 years ago after birth of her son. Hemorrhoids chronic symptoms intermittent. Repost 3 days of rectal pain. She denies any hematochezia. Denies taking anything for symptoms.    Outpatient Medications Prior to Visit  Medication Sig Dispense Refill  . carbamide peroxide (DEBROX) 6.5 % otic solution 5 drops 2 (two) times daily.    . metroNIDAZOLE (METROGEL VAGINAL) 0.75 % vaginal gel Place 1 Applicatorful vaginally at bedtime. For 5 days (Patient not taking: Reported on 03/07/2017) 70 g 0  . NORTREL 0.5/35, 28, 0.5-35 MG-MCG tablet TAKE 1 TABLET BY MOUTH DAILY. (Patient not taking: Reported on 03/07/2017) 94 tablet 0   No facility-administered medications prior to visit.     ROS Review of Systems  Constitutional: Negative.   Respiratory: Negative.   Cardiovascular: Negative.   Gastrointestinal: Positive for rectal pain (history hemorrhoids).  Musculoskeletal: Positive for back pain.    Objective:  BP 100/69 (BP Location: Left Arm, Patient Position: Sitting, Cuff Size: Normal)   Pulse 88   Temp 98.7 F (37.1 C) (Oral)   Resp 18   Ht 4\' 10"  (1.473 m)   Wt 139 lb 9.6 oz (63.3 kg)   LMP 02/27/2017   SpO2 99%   BMI 29.18 kg/m   BP/Weight 03/07/2017 09/15/2016 01/21/2016  Systolic BP 100 113 108  Diastolic BP 69 80 72  Wt. (Lbs) 139.6 139 134.4  BMI 29.18 29.05 28.09     Physical Exam  Constitutional: She appears well-developed and well-nourished.  Eyes: Conjunctivae are normal. Pupils are equal, round, and reactive to light.  Cardiovascular:  Normal rate, regular rhythm, normal heart sounds and intact distal pulses.  Pulmonary/Chest: Effort normal and breath sounds normal.  Abdominal: Soft. Bowel sounds are normal. There is no tenderness. There is CVA tenderness (left sided).  Genitourinary: Rectal exam shows external hemorrhoid. Rectal exam shows guaiac negative stool.  Skin: Skin is warm and dry.  Psychiatric: She has a normal mood and affect.  Nursing note and vitals reviewed.    Assessment & Plan:   1. Left flank pain  - POCT urinalysis dipstick - DG Abd 1 View; Future  2. History of nephrolithiasis  - POCT urinalysis dipstick - DG Abd 1 View; Future - Basic metabolic panel - CBC  3. Hemorrhoids, unspecified hemorrhoid type (-) hemoccult. - Hemoccult - 1 Card (office) - polyethylene glycol powder (GLYCOLAX/MIRALAX) powder; Take 17 g daily as needed by mouth for mild constipation or moderate constipation.  Dispense: 3350 g; Refill: 1 - hydrocortisone (ANUSOL-HC) 2.5 % rectal cream; Place 1 application 2 (two) times daily as needed rectally for hemorrhoids or anal itching. For 7 days.  Dispense: 30 g; Refill: 1  4. Encounter for contraceptive management, unspecified type Patient requested OCP refill at conclusion of visit. Will order urine preg.before refill. - POCT urine pregnancy      Follow-up: Return if symptoms worsen or fail to improve.   Lizbeth BarkMandesia R Hairston FNP

## 2017-03-08 ENCOUNTER — Other Ambulatory Visit: Payer: Self-pay | Admitting: Family Medicine

## 2017-03-08 ENCOUNTER — Telehealth: Payer: Self-pay | Admitting: Family Medicine

## 2017-03-08 DIAGNOSIS — K8020 Calculus of gallbladder without cholecystitis without obstruction: Secondary | ICD-10-CM

## 2017-03-08 DIAGNOSIS — N200 Calculus of kidney: Secondary | ICD-10-CM

## 2017-03-08 LAB — BASIC METABOLIC PANEL
BUN/Creatinine Ratio: 16 (ref 9–23)
BUN: 8 mg/dL (ref 6–24)
CALCIUM: 9.1 mg/dL (ref 8.7–10.2)
CHLORIDE: 101 mmol/L (ref 96–106)
CO2: 27 mmol/L (ref 20–29)
Creatinine, Ser: 0.51 mg/dL — ABNORMAL LOW (ref 0.57–1.00)
GFR calc non Af Amer: 118 mL/min/{1.73_m2} (ref >59)
GFR, EST AFRICAN AMERICAN: 136 mL/min/{1.73_m2} (ref >59)
Glucose: 86 mg/dL (ref 65–99)
POTASSIUM: 4 mmol/L (ref 3.5–5.2)
SODIUM: 140 mmol/L (ref 134–144)

## 2017-03-08 LAB — CBC
HEMOGLOBIN: 12.5 g/dL (ref 11.1–15.9)
Hematocrit: 38 % (ref 34.0–46.6)
MCH: 28.3 pg (ref 26.6–33.0)
MCHC: 32.9 g/dL (ref 31.5–35.7)
MCV: 86 fL (ref 79–97)
PLATELETS: 274 10*3/uL (ref 150–379)
RBC: 4.42 x10E6/uL (ref 3.77–5.28)
RDW: 13.7 % (ref 12.3–15.4)
WBC: 6.8 10*3/uL (ref 3.4–10.8)

## 2017-03-08 MED ORDER — TAMSULOSIN HCL 0.4 MG PO CAPS: 0.4000 mg | ORAL_CAPSULE | Freq: Every day | ORAL | 0 refills | Status: DC

## 2017-03-08 MED ORDER — TRAMADOL HCL 50 MG PO TABS: 50.0000 mg | ORAL_TABLET | Freq: Three times a day (TID) | ORAL | 0 refills | Status: DC | PRN

## 2017-03-08 NOTE — Telephone Encounter (Signed)
Interpreter services used [name] [phone]#[id number]. Patient called and notified of lab results and recommendations. ED precautions discussed. She communicates understanding.

## 2017-03-09 MED FILL — traMADol HCL 50 MG TABS: 50 | 10 days supply | Qty: 30 | Fill #0

## 2017-03-09 MED FILL — TAMSULOSIN HCL 0.4 MG CAP: 0.4 | 30 days supply | Qty: 30 | Fill #0

## 2017-03-09 NOTE — Telephone Encounter (Signed)
-----   Message from Lizbeth BarkMandesia R Hairston, FNP sent at 03/08/2017  2:54 PM EST ----- Regarding: Prescription script and medication Patient imaging positive for kidney stones. Prescription script available for patient pick up, will leave in your mailbox. When patient picks up script provide with urine strainer and specimen hat to stream urine. Thanks.  Mandesia Hairston FNP-BC

## 2017-03-09 NOTE — Telephone Encounter (Signed)
CMA call regarding patient letting her know that she needs to pick up RX   Patient was aware and understood

## 2017-03-15 ENCOUNTER — Encounter: Payer: Self-pay | Admitting: Surgery

## 2017-03-15 ENCOUNTER — Ambulatory Visit (INDEPENDENT_AMBULATORY_CARE_PROVIDER_SITE_OTHER): Payer: No Typology Code available for payment source | Admitting: Surgery

## 2017-03-15 VITALS — BP 112/71 | HR 81 | Temp 98.2°F | Ht <= 58 in | Wt 139.8 lb

## 2017-03-15 DIAGNOSIS — K8020 Calculus of gallbladder without cholecystitis without obstruction: Secondary | ICD-10-CM

## 2017-03-15 NOTE — Patient Instructions (Signed)
Por favor llamenos si tiene alguna pregunta. 

## 2017-03-15 NOTE — Progress Notes (Signed)
Surgical Clinic History and Physical  Referring provider:  Lizbeth Bark, FNP [address] [place], [place] [address]  HISTORY OF PRESENT ILLNESS (HPI):  41 y.o. female presents for evaluation of gallstones, hemorrhoids, and nephro-/uretero-lithiasis. Patient reports via certified medical Spanish-English interpreter that she saw her primary medical practitioner just over 1 week ago for Left-sided back/flank pain x 15 days prior to that appointment, at which time she also mentioned she had been experiencing peri-anal pain and itching x 3 days following transient constipation attributed to dehydration.   Patient describes she previously underwent a procedure (lithotripsy per patient's chart) for Right nephrolithiasis and, though her current Left flank/back pain feels similar to her prior Right flank/back pain, she'd not previously experienced Left nephro-/uretero-lithiasis. She was advised to keep hydrated and strain her urine, which she has done and reports having observed "gravel"/"sand" like debris in her urine, since which her pain has improved.   She was also prescribed a topical steroid cream along with a prn bowel regimen for her hemorrhoidal pain and itching, both of which she reports have completely resolved, and she says she does not wish to pursue further evaluation or surgery for her hemorrhoids at this time. Patient was lastly also previously noted to have gallstones on a RUQ abdominal ultrasound and asks whether her gallstones could be contributing to her LUQ abdominal pain, but she denies any RUQ or epigastric abdominal pain, post-prandial or otherwise and denies any N/V, fever/chills, CP, or SOB.  PAST MEDICAL HISTORY (PMH):  Past Medical History:  Diagnosis Date  . Allergic rhinitis 06/09/2014  . Anemia   . Bartholin cyst 10/04/2012  . Bloating 12/23/2012  . Carpal tunnel syndrome, bilateral 06/09/2014  . Kidney stones   . LLQ abdominal pain 03/09/2015  . Mental disorder    depression  . No pertinent past medical history   . Right flank pain 04/21/2013  . Tinea corporis 01/21/2016  . UTI (urinary tract infection)      PAST SURGICAL HISTORY (PSH):  Past Surgical History:  Procedure Laterality Date  . NO PAST SURGERIES       MEDICATIONS:  Prior to Admission medications   Medication Sig Start Date End Date Taking? Authorizing Provider  hydrocortisone (ANUSOL-HC) 2.5 % rectal cream Place 1 application 2 (two) times daily as needed rectally for hemorrhoids or anal itching. For 7 days. 03/07/17  Yes Hairston, Leonia Reeves R, FNP  norethindrone-ethinyl estradiol (NORTREL 0.5/35, 28,) 0.5-35 MG-MCG tablet Take 1 tablet daily by mouth. 03/07/17  Yes Hairston, Oren Beckmann, FNP  polyethylene glycol powder (GLYCOLAX/MIRALAX) powder Take 17 g daily as needed by mouth for mild constipation or moderate constipation. 03/07/17  Yes Lizbeth Bark, FNP  tamsulosin (FLOMAX) 0.4 MG CAPS capsule Take 1 capsule (0.4 mg total) daily by mouth. 03/08/17  Yes Hairston, Oren Beckmann, FNP     ALLERGIES:  No Known Allergies   SOCIAL HISTORY:  Social History   Socioeconomic History  . Marital status: Married    Spouse name: Not on file  . Number of children: Not on file  . Years of education: Not on file  . Highest education level: Not on file  Social Needs  . Financial resource strain: Not on file  . Food insecurity - worry: Not on file  . Food insecurity - inability: Not on file  . Transportation needs - medical: Not on file  . Transportation needs - non-medical: Not on file  Occupational History  . Not on file  Tobacco Use  .  Smoking status: Never Smoker  . Smokeless tobacco: Never Used  Substance and Sexual Activity  . Alcohol use: No    Comment: ETOH with pregnancy  . Drug use: No  . Sexual activity: Yes  Other Topics Concern  . Not on file  Social History Narrative  . Not on file    The patient currently resides (home / rehab facility / nursing home): Home The  patient normally is (ambulatory / bedbound): Ambulatory  FAMILY HISTORY:  Family History  Problem Relation Age of Onset  . Cancer Father     Otherwise negative/non-contributory.  REVIEW OF SYSTEMS:  Constitutional: denies any other weight loss, fever, chills, or sweats  Eyes: denies any other vision changes, history of eye injury  ENT: denies sore throat, hearing problems  Respiratory: denies shortness of breath, wheezing  Cardiovascular: denies chest pain, palpitations  Gastrointestinal: abdominal pain, N/V, and bowel function as per HPI Musculoskeletal: denies any other joint pains or cramps  Skin: Denies any other rashes or skin discolorations Neurological: denies any other headache, dizziness, weakness  Psychiatric: Denies any other depression, anxiety   All other review of systems were otherwise negative   VITAL SIGNS:  BP 112/71   Pulse 81   Temp 98.2 F (36.8 C) (Oral)   Ht 4\' 10"  (1.473 m)   Wt 139 lb 12.8 oz (63.4 kg)   LMP 02/27/2017   BMI 29.22 kg/m   PHYSICAL EXAM:  Constitutional:  -- Moderately overweight body habitus  -- Awake, alert, and oriented x3  Eyes:  -- Pupils equally round and reactive to light  -- No scleral icterus  Ear, nose, throat:  -- No jugular venous distension -- No nasal drainage, bleeding Pulmonary:  -- No crackles  -- Equal breath sounds bilaterally -- Breathing non-labored at rest Cardiovascular:  -- S1, S2 present  -- No pericardial rubs  Gastrointestinal:  -- Abdomen soft and non-distended with mild LUQ/Left flank tenderness to palpation, no guarding/rebound  -- No abdominal masses appreciated, pulsatile or otherwise  Musculoskeletal and Integumentary:  -- Wounds or skin discoloration: None appreciated -- Extremities: B/L UE and LE FROM, hands and feet warm, no edema  Neurologic:  -- Motor function: Intact and symmetric -- Sensation: Intact and symmetric  Labs:  CBC:  Lab Results  Component Value Date   WBC 6.8  03/07/2017   WBC 8.0 03/09/2015   RBC 4.42 03/07/2017   RBC 4.35 03/09/2015   BMP:  Lab Results  Component Value Date   GLUCOSE 86 03/07/2017   GLUCOSE 78 01/21/2016   CO2 27 03/07/2017   BUN 8 03/07/2017   CREATININE 0.51 (L) 03/07/2017   CREATININE 0.67 01/21/2016   CALCIUM 9.1 03/07/2017     Imaging studies:  Single-View Abdominal X-ray (03/07/2017) There do appear to be small left renal calculi . However no  definite right renal calculi are seen. No calcific are noted along  the expected courses of the ureters, with apparent phlebolith in the left bony pelvis when compared to the prior CT. There do appear to be multiple gallstones which are faintly calcified within the right upper quadrant. The bowel gas pattern is nonspecific.  No acute bony abnormality is seen.  Abdominal Ultrasound (02/02/2016) The gallbladder is filled with echogenic stones. The largest stone measures 2 cm in diameter. There is no gallbladder wall thickening, pericholecystic fluid, or positive sonographic Murphy's sign. Common bile duct diameter: 1.8 mm.  Assessment/Plan:  41 y.o. female with symptomatic Left nephro-/uretero-lithiasis and currently asymptomatic cholelithiasis  and hemorrhoids, complicated by co-morbidities including chronic anemia, prior UTI's, and major depression disorder.   - agree with hydration and high-fiber diet to minimize constipation and nephrolithiasis  - no surgical intervention/management indicated at this time for asymptomatic cholelithiasis or hemorrhoids   - information regarding each of diagnoses discussed were provided to patient and all of her questions answered to her expressed satisfaction   - return to clinic as needed, instructed to call office if any questions or concerns  All of the above recommendations were discussed with the patient, and all of patient's questions were answered to her expressed satisfaction.  Thank you for the opportunity to participate in  this patient's care.  -- Scherrie GerlachJason E. Earlene Plateravis, MD, RPVI [place]: [facility] - Partnering for exceptional care. Office: [phone]

## 2017-03-28 MED FILL — NORTREL 0.5-35 TABLET: 0.5-35 | 28 days supply | Qty: 28 | Fill #1

## 2017-03-30 ENCOUNTER — Ambulatory Visit: Payer: Self-pay | Admitting: Family Medicine

## 2017-04-25 MED FILL — NORTREL 0.5-35 TABLET: 0.5-35 | 28 days supply | Qty: 28 | Fill #2

## 2017-05-24 MED FILL — NORTREL 0.5-35 TABLET: 0.5-35 | 56 days supply | Qty: 56 | Fill #3

## 2017-07-05 ENCOUNTER — Ambulatory Visit: Payer: Self-pay | Attending: Internal Medicine | Admitting: Physician Assistant

## 2017-07-05 VITALS — BP 107/74 | HR 75 | Temp 98.8°F | Ht <= 58 in | Wt 143.6 lb

## 2017-07-05 DIAGNOSIS — H6992 Unspecified Eustachian tube disorder, left ear: Secondary | ICD-10-CM

## 2017-07-05 DIAGNOSIS — F329 Major depressive disorder, single episode, unspecified: Secondary | ICD-10-CM | POA: Insufficient documentation

## 2017-07-05 DIAGNOSIS — G5603 Carpal tunnel syndrome, bilateral upper limbs: Secondary | ICD-10-CM | POA: Insufficient documentation

## 2017-07-05 DIAGNOSIS — H9202 Otalgia, left ear: Secondary | ICD-10-CM | POA: Insufficient documentation

## 2017-07-05 DIAGNOSIS — H9312 Tinnitus, left ear: Secondary | ICD-10-CM | POA: Insufficient documentation

## 2017-07-05 DIAGNOSIS — Z79899 Other long term (current) drug therapy: Secondary | ICD-10-CM | POA: Insufficient documentation

## 2017-07-05 DIAGNOSIS — Z87442 Personal history of urinary calculi: Secondary | ICD-10-CM | POA: Insufficient documentation

## 2017-07-05 MED ORDER — PHENYLEPHRINE HCL 10 MG PO TABS: ORAL_TABLET | ORAL | 3 refills | Status: DC

## 2017-07-05 MED ORDER — FLUTICASONE PROPIONATE 50 MCG/ACT NA SUSP: 2.0000 | Freq: Every day | NASAL | 6 refills | Status: DC

## 2017-07-05 MED FILL — FLUTICASONE PROP 50 MCG SPR: 50 | 30 days supply | Qty: 16 | Fill #0

## 2017-07-05 NOTE — Progress Notes (Signed)
Pt's here for ear pain on her left ear. -feels like water in ears, no drainage.   Pt's stated started 3 months ago.

## 2017-07-05 NOTE — Progress Notes (Signed)
Patient ID: Colleen Warner, female   DOB: [birth year], 42 y.o.   MRN: [id number]     Colleen Warner, is a 42 y.o. female  [id number]  [id number]  DOB - [birth year]  Subjective:  Chief Complaint and HPI: Colleen Warner is a 42 y.o. female here today  With 3 month h/o mild L ear pain on and off, some humming in the ear. No f/c.  No barotrauma.  No h/o ear surgery.  Denies other sinus symptoms but does have h/o allergic rhinitis.  No dizziness/vertigo  Madaline GuthrieFernando with [employer] interpreters translating.    ROS:   Constitutional:  No f/c, No night sweats, No unexplained weight loss. EENT:  No vision changes, No blurry vision, No hearing changes. No other mouth, throat, or ear problems.  Respiratory: No cough, No SOB Cardiac: No CP, no palpitations GI:  No abd pain, No N/V/D. GU: No Urinary s/sx Musculoskeletal: No joint pain Neuro: No headache, no dizziness, no motor weakness.  Skin: No rash Endocrine:  No polydipsia. No polyuria.  Psych: Denies SI/HI  No problems updated.  ALLERGIES: No Known Allergies  PAST MEDICAL HISTORY: Past Medical History:  Diagnosis Date  . Allergic rhinitis 06/09/2014  . Anemia   . Bartholin cyst 10/04/2012  . Bloating 12/23/2012  . Carpal tunnel syndrome, bilateral 06/09/2014  . Kidney stones   . LLQ abdominal pain 03/09/2015  . Mental disorder    depression  . No pertinent past medical history   . Right flank pain 04/21/2013  . Tinea corporis 01/21/2016  . UTI (urinary tract infection)     MEDICATIONS AT HOME: Prior to Admission medications   Medication Sig Start Date End Date Taking? Authorizing Provider  norethindrone-ethinyl estradiol (NORTREL 0.5/35, 28,) 0.5-35 MG-MCG tablet Take 1 tablet daily by mouth. 03/07/17  Yes Hairston, Mandesia R, FNP  fluticasone (FLONASE) 50 MCG/ACT nasal spray Place 2 sprays into both nostrils daily. 07/05/17   Anders SimmondsMcClung, Angela M, PA-C  hydrocortisone (ANUSOL-HC) 2.5 % rectal cream Place  1 application 2 (two) times daily as needed rectally for hemorrhoids or anal itching. For 7 days. Patient not taking: Reported on 07/05/2017 03/07/17   Lizbeth BarkHairston, Mandesia R, FNP  phenylephrine (SUDAFED PE) 10 MG TABS tablet 1 tab 3 times daily for 3 weeks then prn congestion 07/05/17   Georgian CoMcClung, Angela M, PA-C  polyethylene glycol powder (GLYCOLAX/MIRALAX) powder Take 17 g daily as needed by mouth for mild constipation or moderate constipation. Patient not taking: Reported on 07/05/2017 03/07/17   Lizbeth BarkHairston, Mandesia R, FNP  tamsulosin (FLOMAX) 0.4 MG CAPS capsule Take 1 capsule (0.4 mg total) daily by mouth. Patient not taking: Reported on 07/05/2017 03/08/17   Lizbeth BarkHairston, Mandesia R, FNP  metoCLOPramide (REGLAN) 10 MG tablet Take 1 tablet (10 mg total) by mouth every 6 (six) hours as needed for nausea. 04/03/13 02/02/14  Dione BoozeGlick, David, MD     Objective:  EXAM:   Vitals:   07/05/17 0909  BP: 107/74  Pulse: 75  Temp: 98.8 F (37.1 C)  TempSrc: Oral  SpO2: 95%  Weight: 143 lb 9.6 oz (65.1 kg)  Height: 4\' 10"  (1.473 m)    General appearance : A&OX3. NAD. Non-toxic-appearing HEENT: Atraumatic and Normocephalic.  PERRLA. EOM intact. R TM with scarring at inferior portion but otherwise WNL.  L TM also with scarring at inferior aspect and slightly retracted.  No erythema.   Mouth-MMM, post pharynx WNL w/o erythema, No PND. Neck: supple, no JVD. No cervical lymphadenopathy. No thyromegaly Chest/Lungs:  Breathing-non-labored, Good air  entry bilaterally, breath sounds normal without rales, rhonchi, or wheezing  CVS: S1 S2 regular, no murmurs, gallops, rubs  Extremities: Bilateral Lower Ext shows no edema, both legs are warm to touch with = pulse throughout Neurology:  CN II-XII grossly intact, Non focal.   Psych:  TP linear. J/I WNL. Normal speech. Appropriate eye contact and affect.  Skin:  No Rash  Data Review Lab Results  Component Value Date   HGBA1C 5.10 03/09/2015     Assessment & Plan   1.  Disorder of left eustachian tube - fluticasone (FLONASE) 50 MCG/ACT nasal spray; Place 2 sprays into both nostrils daily.  Dispense: 16 g; Refill: 6 - phenylephrine (SUDAFED PE) 10 MG TABS tablet; 1 tab 3 times daily for 3 weeks then prn congestion  Dispense: 42 tablet; Refill: 3 - Ambulatory referral to ENT  2. Tinnitus of left ear - Ambulatory referral to ENT If these issues resolve with conservative management, she can cancel the ENT appt.    Patient have been counseled extensively about nutrition and exercise  Return in about 3 months (around 10/05/2017) for assign new PCP.  The patient was given clear instructions to go to ER or return to medical center if symptoms don't improve, worsen or new problems develop. The patient verbalized understanding. The patient was told to call to get lab results if they haven't heard anything in the next week.     Georgian Co, PA-C [facility] [place], [place] [phone]   07/05/2017, 9:31 AM

## 2017-07-05 NOTE — Patient Instructions (Signed)
Disfuncin de la trompa de Eustaquio (Eustachian Tube Dysfunction) La trompa de Eustaquio conecta el odo medio con la parte posterior de la nariz. Regula la presin de aire en el odo medio al permitir que el aire circule por el odo y la nariz. Tambin ayuda a drenar el lquido del espacio del odo medio. Cuando la trompa de Eustaquio no funciona bien, se puede producir una acumulacin de presin de aire, lquido o ambos en el odo medio. La disfuncin de la trompa de Eustaquio puede afectar a uno o a los dos odos. CAUSAS Esta afeccin ocurre cuando la trompa de Eustaquio se bloquea o no puede abrirse normalmente. Puede ser consecuencia de lo siguiente:  Infecciones en los odos.  Resfriados y otras infecciones de las vas respiratorias superiores.  Alergias.  Irritacin, por ejemplo, por el humo del cigarrillo o los cidos del estmago que vuelven hacia el esfago (reflujo gastroesofgico).  Cambios sbitos en la presin del aire, como cuando baja un avin.  Crecimientos anormales en la nariz o la garganta, como plipos nasales, tumores o tejido engrosado en la parte posterior de la garganta (adenoides). FACTORES DE RIESGO Puede ser ms probable que esta afeccin se desarrolle en las personas que fuman y las personas que tienen sobrepeso. Tambin es ms probable que la disfuncin de la trompa de Eustaquio se produzca en los nios, especialmente los nios que presentan lo siguiente:  Ciertos defectos congnitos en la boca, como fisura del paladar.  Amgdalas y adenoides grandes. SNTOMAS Los sntomas de esta afeccin pueden incluir lo siguiente:  Sensacin de que el odo est tapado.  Dolor de odo.  Ruidos como chasquidos o crujidos en el odo.  Zumbidos en el odo.  Prdida auditiva.  Prdida del equilibrio. Los sntomas pueden empeorar cuando la presin que tiene a su alrededor cambia, como cuando viaja a una zona de mayor altura o en avin. DIAGNSTICO Esta afeccin se  puede diagnosticar en funcin de lo siguiente:  Sus sntomas.  Un examen fsico del odo, de la nariz y de la garganta.  Pruebas en las que se determine lo siguiente: ? El movimiento de la membrana del tmpano (timpanograma). ? La audicin (audiometra). TRATAMIENTO El tratamiento depende de la causa y de la gravedad de la afeccin. Si los sntomas son leves, es posible que pueda aliviarlos haciendo circular aire ("destapar") dentro de los odos. Si tiene sntomas de lquido en los odos, el tratamiento puede incluir lo siguiente:  Descongestivos.  Antihistamnicos.  Aerosoles nasales o gotas para los odos que contengan medicamentos para reducir la hinchazn (corticoides). En algunos casos, puede necesitar un procedimiento para drenar el lquido de la membrana del tmpano (miringotoma). En este procedimiento, se coloca un tubo pequeo en la membrana del tmpano para hacer lo siguiente:  Drenar el lquido.  Restablecer el aire en el espacio del odo medio. INSTRUCCIONES PARA EL CUIDADO EN EL HOGAR  Tome los medicamentos de venta libre y los recetados solamente como se lo haya indicado el mdico.  Utilice las tcnicas recomendadas por el mdico para ayudar a destapar los odos. Estas pueden incluir las siguientes: ? Masticar goma de mascar. ? Bostezos. ? Tragar vigorosamente con frecuencia. ? Cerrar la boca, taparse la nariz y soplar suavemente por la nariz como si tratara de soltar el aire.  No haga ninguna de estas cosas hasta que el mdico lo autorice: ? Viajar a grandes alturas. ? Viajar en avin. ? Trabajar en una cabina o una habitacin presurizada. ? Practicar buceo.  Mantener   secos los odos. Squese bien los odos despus de ducharse o darse un bao.  No fume.  Concurra a todas las visitas de control como se lo haya indicado el mdico. Esto es importante. SOLICITE ATENCIN MDICA SI:  Los sntomas no desaparecen despus del tratamiento.  Los sntomas regresan  despus del tratamiento.  No puede destaparse los odos.  Tiene los siguientes sntomas: ? Fiebre. ? Dolor en el odo. ? Dolor de cabeza o en el cuello. ? Hay lquido que sale del odo.  La audicin cambia de repente.  Se siente muy mareado.  Pierde el equilibrio. Esta informacin no tiene como fin reemplazar el consejo del mdico. Asegrese de hacerle al mdico cualquier pregunta que tenga. Document Released: 12/15/2015 Document Revised: 12/15/2015 Document Reviewed: 05/06/2014 Elsevier Interactive Patient Education  2018 Elsevier Inc.  

## 2017-07-18 MED FILL — NORTREL 0.5-35 TABLET: 0.5-35 | 56 days supply | Qty: 56 | Fill #4

## 2017-09-13 MED FILL — NORTREL 0.5-35 TABLET: 0.5-35 | 56 days supply | Qty: 56 | Fill #5

## 2017-10-08 ENCOUNTER — Encounter: Payer: Self-pay | Admitting: Nurse Practitioner

## 2017-10-08 ENCOUNTER — Ambulatory Visit: Payer: Self-pay | Attending: Nurse Practitioner | Admitting: Nurse Practitioner

## 2017-10-08 VITALS — BP 99/67 | HR 75 | Temp 98.8°F | Ht <= 58 in | Wt 139.0 lb

## 2017-10-08 DIAGNOSIS — Z809 Family history of malignant neoplasm, unspecified: Secondary | ICD-10-CM | POA: Insufficient documentation

## 2017-10-08 DIAGNOSIS — Z793 Long term (current) use of hormonal contraceptives: Secondary | ICD-10-CM | POA: Insufficient documentation

## 2017-10-08 DIAGNOSIS — L219 Seborrheic dermatitis, unspecified: Secondary | ICD-10-CM | POA: Insufficient documentation

## 2017-10-08 DIAGNOSIS — B354 Tinea corporis: Secondary | ICD-10-CM | POA: Insufficient documentation

## 2017-10-08 DIAGNOSIS — Z7952 Long term (current) use of systemic steroids: Secondary | ICD-10-CM | POA: Insufficient documentation

## 2017-10-08 DIAGNOSIS — Z79899 Other long term (current) drug therapy: Secondary | ICD-10-CM | POA: Insufficient documentation

## 2017-10-08 DIAGNOSIS — Z9889 Other specified postprocedural states: Secondary | ICD-10-CM | POA: Insufficient documentation

## 2017-10-08 MED ORDER — KETOCONAZOLE 2 % EX CREA: 1.0000 "application " | TOPICAL_CREAM | Freq: Every day | CUTANEOUS | 0 refills | Status: DC

## 2017-10-08 MED ORDER — CLOTRIMAZOLE 1 % EX CREA: TOPICAL_CREAM | CUTANEOUS | 0 refills | Status: DC

## 2017-10-08 MED ORDER — KETOCONAZOLE 2 % EX SHAM: 1.0000 "application " | MEDICATED_SHAMPOO | CUTANEOUS | 1 refills | Status: DC

## 2017-10-08 MED FILL — FLUTICASONE PROP 50 MCG SPR: 50 | 30 days supply | Qty: 16 | Fill #1

## 2017-10-08 MED FILL — KETOCONAZOLE 2% CREAM: 2 | 15 days supply | Qty: 15 | Fill #0

## 2017-10-08 MED FILL — KETOCONAZOLE 2% SHAMPOO: 2 | 30 days supply | Qty: 120 | Fill #0

## 2017-10-08 NOTE — Patient Instructions (Signed)
Fatigue Fatigue is feeling tired all of the time, a lack of energy, or a lack of motivation. Occasional or mild fatigue is often a normal response to activity or life in general. However, long-lasting (chronic) or extreme fatigue may indicate an underlying medical condition. Follow these instructions at home: Watch your fatigue for any changes. The following actions may help to lessen any discomfort you are feeling:  Talk to your health care provider about how much sleep you need each night. Try to get the required amount every night.  Take medicines only as directed by your health care provider.  Eat a healthy and nutritious diet. Ask your health care provider if you need help changing your diet.  Drink enough fluid to keep your urine clear or pale yellow.  Practice ways of relaxing, such as yoga, meditation, massage therapy, or acupuncture.  Exercise regularly.  Change situations that cause you stress. Try to keep your work and personal routine reasonable.  Do not abuse illegal drugs.  Limit alcohol intake to no more than 1 drink per day for nonpregnant women and 2 drinks per day for men. One drink equals 12 ounces of beer, 5 ounces of wine, or 1 ounces of hard liquor.  Take a multivitamin, if directed by your health care provider.  Contact a health care provider if:  Your fatigue does not get better.  You have a fever.  You have unintentional weight loss or gain.  You have headaches.  You have difficulty: ? Falling asleep. ? Sleeping throughout the night.  You feel angry, guilty, anxious, or sad.  You are unable to have a bowel movement (constipation).  You skin is dry.  Your legs or another part of your body is swollen. Get help right away if:  You feel confused.  Your vision is blurry.  You feel faint or pass out.  You have a severe headache.  You have severe abdominal, pelvic, or back pain.  You have chest pain, shortness of breath, or an irregular or  fast heartbeat.  You are unable to urinate or you urinate less than normal.  You develop abnormal bleeding, such as bleeding from the rectum, vagina, nose, lungs, or nipples.  You vomit blood.  You have thoughts about harming yourself or committing suicide.  You are worried that you might harm someone else. This information is not intended to replace advice given to you by your health care provider. Make sure you discuss any questions you have with your health care provider. Document Released: 02/12/2007 Document Revised: 09/23/2015 Document Reviewed: 08/19/2013 Elsevier Interactive Patient Education  2018 [employer].  [place] (Fatigue) La fatiga es una sensacin de cansancio en todo momento, falta de energa o falta de motivacin. La fatiga ocasional o leve con frecuencia es una reaccin normal a la actividad o la vida en general. Sin embargo, la fatiga de Set designer duracin (crnica) o extrema puede indicar una enfermedad preexistente. INSTRUCCIONES PARA EL CUIDADO EN EL HOGAR Controle su fatiga para ver si hay cambios. Las siguientes indicaciones ayudarn a Psychologist, educational [employer] pueda sentir:  Hable con el mdico acerca de cunto debe dormir cada noche. Trate de dormir la cantidad de tiempo requerida todas las noches.  Tome los medicamentos solamente como se lo haya indicado el mdico.  Siga una dieta saludable y nutritiva. Pida ayuda al mdico si necesita hacer cambios en su dieta.  Beba suficiente lquido para Photographer orina clara o de color amarillo plido.  Practique actividades que lo relajen,  como yoga, meditacin, terapia de [place] Park[place]ajes o acupuntura.  Haga ejercicios regularmente.  Cambie las situaciones que le provocan estrs. Trate de que su [place]ina de trabajo y personal sea moderada.  No consuma drogas.  Limite el consumo de alcohol a no ms de 1 medida por da si es mujer y no est Orthoptistembarazada, y 2 medidas si es hombre. Una medida equivale a 12onzas de cerveza,  5onzas de vino o 1onzas de bebidas alcohlicas de alta graduacin.  Tome una multivitamina, si se lo indic el mdico. SOLICITE ATENCIN MDICA SI:  La fatiga no mejora.  Tiene fiebre.  Tiene prdida o aumento involuntario de Allenpeso.  Tiene dolores de Turkmenistancabeza.  Tiene dificultad para: ? Dormirse. ? Dormir durante toda la noche.  Se siente enojado, culpable, ansioso o triste.  No puede defecar (estreimiento).  Tiene la piel seca.  Tiene hinchadas las piernas u otra parte del cuerpo. SOLICITE ATENCIN MDICA DE INMEDIATO SI:  Se siente confundido.  Tiene visin borrosa.  Sufre mareos o se desmaya.  Sufre un dolor intenso de Turkmenistancabeza.  Siente dolor intenso en el abdomen, la pelvis o la espalda.  Tiene dolor de pecho, dificultad para respirar, o latidos cardacos irregulares o acelerados.  No puede orinar u Omnicareorina menos de lo normal.  Presenta sangrado anormal, como sangrado del recto, la vagina, la nariz, los pulmones o los pezones.  Vomita sangre.  Tiene pensamientos acerca de hacerse dao a s mismo o cometer suicidio.  Le preocupa la posibilidad de hacerle dao a otra persona. Esta informacin no tiene Theme park managercomo fin reemplazar el consejo del mdico. Asegrese de hacerle al mdico cualquier pregunta que tenga. Document Released: 08/03/2008 Document Revised: 08/09/2015 Document Reviewed: 08/19/2013 Elsevier Interactive Patient Education  [employer]2018 Elsevier Inc.

## 2017-10-08 NOTE — Progress Notes (Signed)
Assessment & Plan:  Colleen Warner was seen today for establish care.  Diagnoses and all orders for this visit:  Tinea corporis -     clotrimazole (LOTRIMIN) 1 % cream; Apply 1 application topically 2 (two) times a day to the lesion below the left underarm.  Seborrheic dermatitis of scalp -     ketoconazole (NIZORAL) 2 % shampoo; Apply 1 application topically 2 (two) times a week. -     ketoconazole (NIZORAL) 2 % cream; Apply 1 application topically daily.    Patient has been counseled on age-appropriate routine health concerns for screening and prevention. These are reviewed and up-to-date. Referrals have been placed accordingly. Immunizations are up-to-date or declined.    Subjective:   Chief Complaint  Patient presents with  . Establish Care    Pt. is here for establish care. Pt. stated she have red spots on her head and her hair don't grow back that part. Pt. stated she have red hives under her armpit.    HPI Colleen Warner Warner 42 y.o. female presents to office today to establish care. VRI was used to communicate directly with patient for the entire encounter including providing detailed patient instructions.     Rash Patient presents with a rash.  Symptoms have been present for 2 months.  The rash is located under the left axilla. Since then it has not spread. Parent has not tried anything for initial treatment. Discomfort is mild. Patient does not have fever. Recent illnesses: none. Sick contacts: none known   Seborrhea Patient complains of seborrheic dermatitis. Patient complains of itching, scaling, rash at the scalp.  Symptoms have been ongoing for about several months. Previous treatment has included Nizoral shampoo, Nizoral cream with excellent improvement.      Review of Systems  Constitutional: Negative for fever, malaise/fatigue and weight loss.  HENT: Negative.  Negative for nosebleeds.   Eyes: Negative.  Negative for blurred vision, double vision and  photophobia.  Respiratory: Negative.  Negative for cough and shortness of breath.   Cardiovascular: Negative.  Negative for chest pain, palpitations and leg swelling.  Gastrointestinal: Negative.  Negative for heartburn, nausea and vomiting.  Musculoskeletal: Negative.  Negative for myalgias.  Skin: Positive for itching and rash.  Neurological: Negative.  Negative for dizziness, focal weakness, seizures and headaches.  Psychiatric/Behavioral: Negative.  Negative for suicidal ideas.    Past Medical History:  Diagnosis Date  . Allergic rhinitis 06/09/2014  . Anemia   . Bartholin cyst 10/04/2012  . Bloating 12/23/2012  . Carpal tunnel syndrome, bilateral 06/09/2014  . Kidney stones   . LLQ abdominal pain 03/09/2015  . Mental disorder    depression  . No pertinent past medical history   . Right flank pain 04/21/2013  . Tinea corporis 01/21/2016  . UTI (urinary tract infection)     Past Surgical History:  Procedure Laterality Date  . KIDNEY STONE SURGERY      Family History  Problem Relation Age of Onset  . Cancer Father     Social History Reviewed with no changes to be made today.   Outpatient Medications Prior to Visit  Medication Sig Dispense Refill  . fluticasone (FLONASE) 50 MCG/ACT nasal spray Place 2 sprays into both nostrils daily. 16 g 6  . norethindrone-ethinyl estradiol (NORTREL 0.5/35, 28,) 0.5-35 MG-MCG tablet Take 1 tablet daily by mouth. 30 tablet 11  . phenylephrine (SUDAFED PE) 10 MG TABS tablet 1 tab 3 times daily for 3 weeks then prn congestion 42 tablet 3  .  hydrocortisone (ANUSOL-HC) 2.5 % rectal cream Place 1 application 2 (two) times daily as needed rectally for hemorrhoids or anal itching. For 7 days. (Patient not taking: Reported on 07/05/2017) 30 g 1  . polyethylene glycol powder (GLYCOLAX/MIRALAX) powder Take 17 g daily as needed by mouth for mild constipation or moderate constipation. (Patient not taking: Reported on 07/05/2017) 3350 g 1  . tamsulosin (FLOMAX)  0.4 MG CAPS capsule Take 1 capsule (0.4 mg total) daily by mouth. (Patient not taking: Reported on 07/05/2017) 30 capsule 0   No facility-administered medications prior to visit.     No Known Allergies     Objective:    BP 99/67 (BP Location: Left Arm, Patient Position: Sitting, Cuff Size: Normal)   Pulse 75   Temp 98.8 F (37.1 C) (Oral)   Ht 4\' 10"  (1.473 m)   Wt 139 lb (63 kg)   LMP 09/28/2017   SpO2 97%   BMI 29.05 kg/m  Wt Readings from Last 3 Encounters:  10/08/17 139 lb (63 kg)  07/05/17 143 lb 9.6 oz (65.1 kg)  03/15/17 139 lb 12.8 oz (63.4 kg)    Physical Exam  Constitutional: She is oriented to person, place, and time. She appears well-developed and well-nourished. She is cooperative.  HENT:  Head: Normocephalic and atraumatic.  Eyes: EOM are normal.  Neck: Normal range of motion.  Cardiovascular: Normal rate, regular rhythm and normal heart sounds. Exam reveals no gallop and no friction rub.  No murmur heard. Pulmonary/Chest: Effort normal and breath sounds normal. No tachypnea. No respiratory distress. She has no decreased breath sounds. She has no wheezes. She has no rhonchi. She has no rales. She exhibits no tenderness.  Abdominal: Soft. Bowel sounds are normal.  Musculoskeletal: Normal range of motion. She exhibits no edema.       Left upper arm: She exhibits no tenderness.  Pruritic circular erythematous patch of skin under left axilla. There are also multiple areas of macular red lesions in the scalp which resemble seborrheic dermatitis. She reports using a nizoral shampoo in the past which significantly improved her symptoms.  Neurological: She is alert and oriented to person, place, and time. Coordination normal.  Skin: Skin is warm and dry. Rash noted. Rash is macular.  Psychiatric: She has a normal mood and affect. Her behavior is normal. Judgment and thought content normal.  Nursing note and vitals reviewed.      Patient has been counseled extensively  about nutrition and exercise as well as the importance of adherence with medications and regular follow-up. The patient was given clear instructions to go to ER or return to medical center if symptoms don't improve, worsen or new problems develop. The patient verbalized understanding.   Follow-up: Return in about 8 weeks (around 12/03/2017).   Claiborne Rigg, FNP-BC [facility] [place], [place] [phone]   10/08/2017, 12:50 PM

## 2017-10-10 ENCOUNTER — Ambulatory Visit: Payer: Self-pay | Attending: Family Medicine

## 2017-11-12 MED FILL — NORTREL 0.5-35 TABLET: 0.5-35 | 56 days supply | Qty: 56 | Fill #6

## 2017-12-10 ENCOUNTER — Ambulatory Visit: Payer: Self-pay | Attending: Nurse Practitioner | Admitting: Nurse Practitioner

## 2017-12-10 ENCOUNTER — Encounter: Payer: Self-pay | Admitting: Nurse Practitioner

## 2017-12-10 VITALS — BP 102/77 | HR 77 | Temp 99.0°F | Ht <= 58 in | Wt 138.8 lb

## 2017-12-10 DIAGNOSIS — L210 Seborrhea capitis: Secondary | ICD-10-CM | POA: Insufficient documentation

## 2017-12-10 DIAGNOSIS — B359 Dermatophytosis, unspecified: Secondary | ICD-10-CM | POA: Insufficient documentation

## 2017-12-10 DIAGNOSIS — Z0000 Encounter for general adult medical examination without abnormal findings: Secondary | ICD-10-CM | POA: Insufficient documentation

## 2017-12-10 NOTE — Progress Notes (Signed)
Assessment & Plan:  Colleen Warner was seen today for follow-up.  Diagnoses and all orders for this visit:  Seborrhea capitis -     Ambulatory referral to Dermatology  Routine adult health maintenance -     CBC -     CMP14+EGFR -     TSH -     VITAMIN D 25 Hydroxy (Vit-D Deficiency, Fractures)    Patient has been counseled on age-appropriate routine health concerns for screening and prevention. These are reviewed and up-to-date. Referrals have been placed accordingly. Immunizations are up-to-date or declined.    Subjective:   Chief Complaint  Patient presents with  . Follow-up    Pt. stated her scalp rash is still the same.    HPI Colleen Warner 42 y.o. female presents to office today for follow up to tinea corporis and seborrheic dermatitis.  She was prescribed nizoral cream and shampoo as well as lotrimin at her last office appointment with me on 10-08-2017. Today she reports the rash under her left arm has resolved. However she continues to have the rash on her scalp which has not improved with nizoral.   Tinea Patient complains of probable tinea capitis. Lesions are located on the generalized scalp mainly near the temples. Symptoms have been ongoing for about several months. Previous evaluation and treatment has included prescription topical antifungal per med orders: not very effective with poor improvement.  Patient denies alcohol overuse, fever, hematemesis, melena and possibility of pregnancy. She was prescribed Nizoral in the past with significant improvement however after prescribing nizoral recently for her she endorses no improvement.    Review of Systems  Constitutional: Negative for fever, malaise/fatigue and weight loss.  Eyes: Negative.  Negative for blurred vision, double vision and photophobia.  Respiratory: Negative.  Negative for cough and shortness of breath.   Cardiovascular: Negative.  Negative for chest pain, palpitations and leg swelling.    Gastrointestinal: Negative.  Negative for heartburn, nausea and vomiting.  Musculoskeletal: Negative.  Negative for myalgias.  Skin: Positive for rash.  Neurological: Negative.  Negative for dizziness, focal weakness, seizures and headaches.  Endo/Heme/Allergies: Positive for environmental allergies.  Psychiatric/Behavioral: Negative.  Negative for suicidal ideas.    Past Medical History:  Diagnosis Date  . Allergic rhinitis 06/09/2014  . Anemia   . Bartholin cyst 10/04/2012  . Bloating 12/23/2012  . Carpal tunnel syndrome, bilateral 06/09/2014  . Kidney stones   . LLQ abdominal pain 03/09/2015  . Mental disorder    depression  . No pertinent past medical history   . Right flank pain 04/21/2013  . Tinea corporis 01/21/2016  . UTI (urinary tract infection)     Past Surgical History:  Procedure Laterality Date  . KIDNEY STONE SURGERY      Family History  Problem Relation Age of Onset  . Cancer Father     Social History Reviewed with no changes to be made today.   Outpatient Medications Prior to Visit  Medication Sig Dispense Refill  . fluticasone (FLONASE) 50 MCG/ACT nasal spray Place 2 sprays into both nostrils daily. 16 g 6  . ketoconazole (NIZORAL) 2 % shampoo Apply 1 application topically 2 (two) times a week. 120 mL 1  . norethindrone-ethinyl estradiol (NORTREL 0.5/35, 28,) 0.5-35 MG-MCG tablet Take 1 tablet daily by mouth. 30 tablet 11  . clotrimazole (LOTRIMIN) 1 % cream Apply 1 application topically 2 (two) times a day to the lesion below the left underarm. (Patient not taking: Reported on 12/10/2017) 30 g 0  .  hydrocortisone (ANUSOL-HC) 2.5 % rectal cream Place 1 application 2 (two) times daily as needed rectally for hemorrhoids or anal itching. For 7 days. (Patient not taking: Reported on 07/05/2017) 30 g 1  . ketoconazole (NIZORAL) 2 % cream Apply 1 application topically daily. (Patient not taking: Reported on 12/10/2017) 15 g 0  . phenylephrine (SUDAFED PE) 10 MG TABS  tablet 1 tab 3 times daily for 3 weeks then prn congestion (Patient not taking: Reported on 12/10/2017) 42 tablet 3  . polyethylene glycol powder (GLYCOLAX/MIRALAX) powder Take 17 g daily as needed by mouth for mild constipation or moderate constipation. (Patient not taking: Reported on 07/05/2017) 3350 g 1  . tamsulosin (FLOMAX) 0.4 MG CAPS capsule Take 1 capsule (0.4 mg total) daily by mouth. (Patient not taking: Reported on 07/05/2017) 30 capsule 0   No facility-administered medications prior to visit.     No Known Allergies     Objective:    BP 102/77 (BP Location: Left Arm, Patient Position: Sitting, Cuff Size: Normal)   Pulse 77   Temp 99 F (37.2 C) (Oral)   Ht '4\' 10"'$  (1.473 m)   Wt 138 lb 12.8 oz (63 kg)   LMP 12/08/2017   SpO2 97%   BMI 29.01 kg/m  Wt Readings from Last 3 Encounters:  12/10/17 138 lb 12.8 oz (63 kg)  10/08/17 139 lb (63 kg)  07/05/17 143 lb 9.6 oz (65.1 kg)    Physical Exam  Constitutional: She is oriented to person, place, and time. She appears well-developed and well-nourished. She is cooperative.  HENT:  Head: Normocephalic and atraumatic.  Eyes: EOM are normal.  Neck: Normal range of motion.  Cardiovascular: Normal rate, regular rhythm and normal heart sounds. Exam reveals no gallop and no friction rub.  No murmur heard. Pulmonary/Chest: Effort normal and breath sounds normal. No tachypnea. No respiratory distress. She has no decreased breath sounds. She has no wheezes. She has no rhonchi. She has no rales. She exhibits no tenderness.  Abdominal: Soft. Bowel sounds are normal.  Musculoskeletal: Normal range of motion. She exhibits no edema.  Neurological: She is alert and oriented to person, place, and time. Coordination normal.  Skin: Skin is warm and dry. Rash noted. Rash is macular.  Erythematous macular lesions located in the bilateral hairline. There is no crusting or drainage present. Patient denies pain with palpation of lesions.     Psychiatric: She has a normal mood and affect. Her behavior is normal. Judgment and thought content normal.  Nursing note and vitals reviewed.        Patient has been counseled extensively about nutrition and exercise as well as the importance of adherence with medications and regular follow-up. The patient was given clear instructions to go to ER or return to medical center if symptoms don't improve, worsen or new problems develop. The patient verbalized understanding.   Follow-up: Return in about 3 months (around 03/12/2018) for physical.   [name], FNP-BC [facility] and [facility] [place], [place]   12/10/2017, 2:02 PM

## 2017-12-11 LAB — CMP14+EGFR
ALBUMIN: 4.2 g/dL (ref 3.5–5.5)
ALT: 9 IU/L (ref 0–32)
AST: 15 IU/L (ref 0–40)
Albumin/Globulin Ratio: 1.6 (ref 1.2–2.2)
Alkaline Phosphatase: 74 IU/L (ref 39–117)
BUN / CREAT RATIO: 20 (ref 9–23)
BUN: 11 mg/dL (ref 6–24)
Bilirubin Total: 0.3 mg/dL (ref 0.0–1.2)
CHLORIDE: 102 mmol/L (ref 96–106)
CO2: 22 mmol/L (ref 20–29)
Calcium: 9 mg/dL (ref 8.7–10.2)
Creatinine, Ser: 0.55 mg/dL — ABNORMAL LOW (ref 0.57–1.00)
GFR calc non Af Amer: 114 mL/min/{1.73_m2} (ref >59)
GFR, EST AFRICAN AMERICAN: 132 mL/min/{1.73_m2} (ref >59)
GLUCOSE: 81 mg/dL (ref 65–99)
Globulin, Total: 2.6 g/dL (ref 1.5–4.5)
Potassium: 4.2 mmol/L (ref 3.5–5.2)
Sodium: 138 mmol/L (ref 134–144)
TOTAL PROTEIN: 6.8 g/dL (ref 6.0–8.5)

## 2017-12-11 LAB — CBC
HEMOGLOBIN: 12.4 g/dL (ref 11.1–15.9)
Hematocrit: 38.5 % (ref 34.0–46.6)
MCH: 29 pg (ref 26.6–33.0)
MCHC: 32.2 g/dL (ref 31.5–35.7)
MCV: 90 fL (ref 79–97)
PLATELETS: 284 10*3/uL (ref 150–450)
RBC: 4.27 x10E6/uL (ref 3.77–5.28)
RDW: 14.1 % (ref 12.3–15.4)
WBC: 7.6 10*3/uL (ref 3.4–10.8)

## 2017-12-11 LAB — TSH: TSH: 1.88 u[IU]/mL (ref 0.450–4.500)

## 2017-12-12 LAB — VITAMIN D 25 HYDROXY (VIT D DEFICIENCY, FRACTURES): Vit D, 25-Hydroxy: 16 ng/mL — ABNORMAL LOW (ref 30.0–100.0)

## 2017-12-12 LAB — SPECIMEN STATUS REPORT

## 2017-12-13 ENCOUNTER — Telehealth: Payer: Self-pay

## 2017-12-13 ENCOUNTER — Other Ambulatory Visit: Payer: Self-pay | Admitting: Nurse Practitioner

## 2017-12-13 MED ORDER — VITAMIN D (ERGOCALCIFEROL) 1.25 MG (50000 UNIT) PO CAPS: 50000.0000 [IU] | ORAL_CAPSULE | ORAL | 1 refills | Status: DC

## 2017-12-13 MED FILL — VIT D2 1.25 MG (50,000 UNIT: 1.25 MG | 28 days supply | Qty: 4 | Fill #0

## 2017-12-13 NOTE — Telephone Encounter (Signed)
-----   Message from Claiborne RiggZelda W Fleming, NP sent at 12/13/2017  1:39 AM EDT ----- Vitamin D is low. Will need to start prescription Vitamin D. Thyroid level is normal. Please inform patient that all other laboratory results are normal. Continue healthy eating habit and regular physical exercise at least 3 times a week, 50 minutes each time or 5 days a week for 30 minutes each day

## 2017-12-13 NOTE — Telephone Encounter (Signed)
CMA attempt to call patient to inform on results.  No answer and left a VM for patient to call back.  If patient call back, please inform:  Vitamin D is low. Will need to start prescription Vitamin D. Thyroid level is normal. Please inform patient that all other laboratory results are normal. Continue healthy eating habit and regular physical exercise at least 3 times a week, 50 minutes each time or 5 days a week for 30 minutes each day  A letter will be send out to reach patient.

## 2018-01-07 MED FILL — VIT D2 1.25 MG (50,000 UNIT: 1.25 MG | 28 days supply | Qty: 4 | Fill #1

## 2018-01-08 MED FILL — NORTREL 0.5-35 TABLET: 0.5-35 | 28 days supply | Qty: 28 | Fill #1

## 2018-01-10 ENCOUNTER — Other Ambulatory Visit: Payer: Self-pay

## 2018-01-10 DIAGNOSIS — Z760 Encounter for issue of repeat prescription: Secondary | ICD-10-CM

## 2018-01-10 MED ORDER — NORETHINDRONE-ETH ESTRADIOL 0.5-35 MG-MCG PO TABS: 1.0000 | ORAL_TABLET | Freq: Every day | ORAL | 2 refills | Status: DC

## 2018-02-04 MED FILL — NORTREL 0.5-35 TABLET: 0.5-35 | 28 days supply | Qty: 28 | Fill #0

## 2018-02-04 MED FILL — VIT D2 1.25 MG (50,000 UNIT: 1.25 MG | 28 days supply | Qty: 4 | Fill #2

## 2018-02-27 MED FILL — VIT D2 1.25 MG (50,000 UNIT: 1.25 MG | 28 days supply | Qty: 4 | Fill #3

## 2018-02-27 MED FILL — NORTREL 0.5-35 TABLET: 0.5-35 | 28 days supply | Qty: 28 | Fill #1

## 2018-02-28 MED FILL — TERBINAFINE HCL 250 MG TAB: 250 | 14 days supply | Qty: 14 | Fill #0

## 2018-02-28 MED FILL — CICLOPIROX 0.77% CREAM: 0.77 | 20 days supply | Qty: 30 | Fill #0

## 2018-03-12 ENCOUNTER — Encounter: Payer: Self-pay | Admitting: Nurse Practitioner

## 2018-03-12 ENCOUNTER — Ambulatory Visit: Payer: Self-pay | Attending: Nurse Practitioner | Admitting: Nurse Practitioner

## 2018-03-12 VITALS — BP 104/72 | HR 69 | Ht <= 58 in | Wt 140.0 lb

## 2018-03-12 DIAGNOSIS — H6992 Unspecified Eustachian tube disorder, left ear: Secondary | ICD-10-CM

## 2018-03-12 DIAGNOSIS — G5603 Carpal tunnel syndrome, bilateral upper limbs: Secondary | ICD-10-CM | POA: Insufficient documentation

## 2018-03-12 DIAGNOSIS — Z79899 Other long term (current) drug therapy: Secondary | ICD-10-CM | POA: Insufficient documentation

## 2018-03-12 DIAGNOSIS — M542 Cervicalgia: Secondary | ICD-10-CM | POA: Insufficient documentation

## 2018-03-12 DIAGNOSIS — Z0000 Encounter for general adult medical examination without abnormal findings: Secondary | ICD-10-CM | POA: Insufficient documentation

## 2018-03-12 DIAGNOSIS — Z3041 Encounter for surveillance of contraceptive pills: Secondary | ICD-10-CM

## 2018-03-12 MED ORDER — PHENYLEPHRINE HCL 10 MG PO TABS
ORAL_TABLET | ORAL | 3 refills | Status: DC
Start: 2018-03-12 — End: 2018-04-08

## 2018-03-12 MED ORDER — NORETHINDRONE-ETH ESTRADIOL 0.5-35 MG-MCG PO TABS: 1.0000 | ORAL_TABLET | Freq: Every day | ORAL | 6 refills | Status: DC

## 2018-03-12 MED ORDER — FLUTICASONE PROPIONATE 50 MCG/ACT NA SUSP: 2.0000 | Freq: Every day | NASAL | 6 refills | Status: DC

## 2018-03-12 MED ORDER — TIZANIDINE HCL 4 MG PO CAPS: 4.0000 mg | ORAL_CAPSULE | Freq: Every day | ORAL | 1 refills | Status: DC

## 2018-03-12 MED FILL — FLUTICASONE PROP 50 MCG SPR: 50 | 30 days supply | Qty: 16 | Fill #0

## 2018-03-12 MED FILL — tiZANidine HCL 4 MG TABS: 4 | 30 days supply | Qty: 30 | Fill #0

## 2018-03-12 NOTE — Progress Notes (Signed)
Assessment & Plan:  Colleen Warner was seen today for annual exam.  Diagnoses and all orders for this visit:  Well woman exam without gynecological exam  Disorder of left eustachian tube -     fluticasone (FLONASE) 50 MCG/ACT nasal spray; Place 2 sprays into both nostrils daily. -     phenylephrine (SUDAFED PE) 10 MG TABS tablet; 1 tab 3 times daily for 3 weeks then prn congestion Will refer to ENT if no resolution   Uses oral contraception -     norethindrone-ethinyl estradiol (NORTREL 0.5/35, 28,) 0.5-35 MG-MCG tablet; Take 1 tablet by mouth daily.  Neck pain on right side -     tiZANidine (ZANAFLEX) 4 MG capsule; Take 1 capsule (4 mg total) by mouth at bedtime. May alternate with heat and ice application for pain relief. May also alternate with acetaminophen and Ibuprofen as prescribed pain relief. Other alternatives include massage, acupuncture and water aerobics.     Patient has been counseled on age-appropriate routine health concerns for screening and prevention. These are reviewed and up-to-date. Referrals have been placed accordingly. Immunizations are up-to-date or declined.    Subjective:   Chief Complaint  Patient presents with  . Annual Exam    pt. is here for a physical.   HPI Colleen Warner 42 y.o. female presents to office today for annual physical. She recently had PAP performed. She was referred to [facility] for mammogram today.   Review of Systems  Constitutional: Negative.  Negative for chills, fever, malaise/fatigue and weight loss.  HENT: Positive for ear pain. Negative for congestion, ear discharge, hearing loss, nosebleeds, sinus pain, sore throat and tinnitus.   Eyes: Negative.  Negative for blurred vision, double vision, photophobia and pain.  Respiratory: Negative.  Negative for cough, sputum production, shortness of breath, wheezing and stridor.   Cardiovascular: Negative.  Negative for chest pain and leg swelling.  Gastrointestinal: Negative.   Negative for abdominal pain, constipation, diarrhea, heartburn, nausea and vomiting.  Genitourinary: Negative.   Musculoskeletal: Negative.  Negative for joint pain and myalgias.  Skin: Negative.  Negative for rash.  Neurological: Negative.  Negative for dizziness, tremors, speech change, focal weakness, seizures and headaches.  Endo/Heme/Allergies: Positive for environmental allergies.  Psychiatric/Behavioral: Negative.  Negative for depression and suicidal ideas. The patient is not nervous/anxious and does not have insomnia.     Past Medical History:  Diagnosis Date  . Allergic rhinitis 06/09/2014  . Anemia   . Bartholin cyst 10/04/2012  . Bloating 12/23/2012  . Carpal tunnel syndrome, bilateral 06/09/2014  . Kidney stones   . LLQ abdominal pain 03/09/2015  . Mental disorder    depression  . No pertinent past medical history   . Right flank pain 04/21/2013  . Tinea corporis 01/21/2016  . UTI (urinary tract infection)     Past Surgical History:  Procedure Laterality Date  . KIDNEY STONE SURGERY      Family History  Problem Relation Age of Onset  . Cancer Father     Social History Reviewed with no changes to be made today.   Outpatient Medications Prior to Visit  Medication Sig Dispense Refill  . Vitamin D, Ergocalciferol, (DRISDOL) 50000 units CAPS capsule Take 1 capsule (50,000 Units total) by mouth every 7 (seven) days. 12 capsule 1  . fluticasone (FLONASE) 50 MCG/ACT nasal spray Place 2 sprays into both nostrils daily. 16 g 6  . norethindrone-ethinyl estradiol (NORTREL 0.5/35, 28,) 0.5-35 MG-MCG tablet Take 1 tablet by mouth daily. 28 tablet 2  .  clotrimazole (LOTRIMIN) 1 % cream Apply 1 application topically 2 (two) times a day to the lesion below the left underarm. (Patient not taking: Reported on 12/10/2017) 30 g 0  . hydrocortisone (ANUSOL-HC) 2.5 % rectal cream Place 1 application 2 (two) times daily as needed rectally for hemorrhoids or anal itching. For 7 days. (Patient  not taking: Reported on 07/05/2017) 30 g 1  . ketoconazole (NIZORAL) 2 % cream Apply 1 application topically daily. (Patient not taking: Reported on 12/10/2017) 15 g 0  . ketoconazole (NIZORAL) 2 % shampoo Apply 1 application topically 2 (two) times a week. (Patient not taking: Reported on 03/12/2018) 120 mL 1  . polyethylene glycol powder (GLYCOLAX/MIRALAX) powder Take 17 g daily as needed by mouth for mild constipation or moderate constipation. (Patient not taking: Reported on 07/05/2017) 3350 g 1  . tamsulosin (FLOMAX) 0.4 MG CAPS capsule Take 1 capsule (0.4 mg total) daily by mouth. (Patient not taking: Reported on 07/05/2017) 30 capsule 0  . phenylephrine (SUDAFED PE) 10 MG TABS tablet 1 tab 3 times daily for 3 weeks then prn congestion (Patient not taking: Reported on 12/10/2017) 42 tablet 3   No facility-administered medications prior to visit.     No Known Allergies     Objective:    BP 104/72 (BP Location: Left Arm, Patient Position: Sitting, Cuff Size: Normal)   Pulse 69   Ht 4\' 10"  (1.473 m)   Wt 140 lb (63.5 kg)   SpO2 99%   BMI 29.26 kg/m  Wt Readings from Last 3 Encounters:  03/12/18 140 lb (63.5 kg)  12/10/17 138 lb 12.8 oz (63 kg)  10/08/17 139 lb (63 kg)    Physical Exam  Constitutional: She is oriented to person, place, and time. She appears well-developed and well-nourished.  HENT:  Head: Normocephalic and atraumatic.  Right Ear: Hearing, external ear and ear canal normal. Tympanic membrane is scarred. A middle ear effusion is present.  Left Ear: Hearing, external ear and ear canal normal. Tympanic membrane is scarred. A middle ear effusion is present.  Nose: Mucosal edema present.  Mouth/Throat: Uvula is midline, oropharynx is clear and moist and mucous membranes are normal. No oropharyngeal exudate.  Eyes: Pupils are equal, round, and reactive to light. Conjunctivae and EOM are normal. Right eye exhibits no discharge. No scleral icterus.  Neck: Normal range of  motion. Neck supple. No tracheal deviation present. No thyromegaly present.  Cardiovascular: Normal rate, regular rhythm, normal heart sounds and intact distal pulses. Exam reveals no friction rub.  No murmur heard. Pulses:      Dorsalis pedis pulses are 2+ on the right side, and 2+ on the left side.       Posterior tibial pulses are 2+ on the right side, and 2+ on the left side.  Pulmonary/Chest: Effort normal and breath sounds normal. No accessory muscle usage. No respiratory distress. She has no decreased breath sounds. She has no wheezes. She has no rhonchi. She has no rales. She exhibits no tenderness. Right breast exhibits no inverted nipple, no mass, no nipple discharge, no skin change and no tenderness. Left breast exhibits no inverted nipple, no mass, no nipple discharge, no skin change and no tenderness. Breasts are symmetrical.  Abdominal: Soft. Bowel sounds are normal. She exhibits no distension and no mass. There is no tenderness. There is no rebound and no guarding.  Musculoskeletal: Normal range of motion. She exhibits no edema, tenderness or deformity.       Back:  Feet:  Right Foot:  Skin Integrity: Negative for callus or dry skin.  Left Foot:  Skin Integrity: Negative for callus or dry skin.  Lymphadenopathy:    She has no cervical adenopathy.  Neurological: She is alert and oriented to person, place, and time. She has normal strength and normal reflexes. She displays no tremor. No cranial nerve deficit or sensory deficit. She exhibits normal muscle tone. She displays a negative Romberg sign. She displays no seizure activity. Coordination and gait normal.  Reflex Scores:      Patellar reflexes are 2+ on the right side and 2+ on the left side. Skin: Skin is warm and dry. No erythema.  Psychiatric: She has a normal mood and affect. Her speech is normal and behavior is normal. Judgment and thought content normal.       Patient has been counseled extensively about nutrition  and exercise as well as the importance of adherence with medications and regular follow-up. The patient was given clear instructions to go to ER or return to medical center if symptoms don't improve, worsen or new problems develop. The patient verbalized understanding.   Follow-up: Return in about 4 weeks (around 04/09/2018) for ear congestion, neck pain.   Claiborne RiggZelda W Shivaun Bilello, FNP-BC [facility] [place], [place] [phone]   03/12/2018, 2:18 PM

## 2018-03-12 NOTE — Patient Instructions (Addendum)
You can try Over the Counter Vitamin B12 or B complex for energy.   Vitamin B12 1000 mg 2 tablets daily

## 2018-04-01 MED FILL — NORTREL 0.5-35 TABLET: 0.5-35 | 28 days supply | Qty: 28 | Fill #2

## 2018-04-08 ENCOUNTER — Encounter: Payer: Self-pay | Admitting: Nurse Practitioner

## 2018-04-08 ENCOUNTER — Ambulatory Visit: Payer: Self-pay | Attending: Nurse Practitioner | Admitting: Nurse Practitioner

## 2018-04-08 VITALS — BP 97/63 | HR 77 | Temp 97.6°F | Ht <= 58 in | Wt 138.8 lb

## 2018-04-08 DIAGNOSIS — H6982 Other specified disorders of Eustachian tube, left ear: Secondary | ICD-10-CM | POA: Insufficient documentation

## 2018-04-08 DIAGNOSIS — Z79899 Other long term (current) drug therapy: Secondary | ICD-10-CM | POA: Insufficient documentation

## 2018-04-08 DIAGNOSIS — H6992 Unspecified Eustachian tube disorder, left ear: Secondary | ICD-10-CM

## 2018-04-08 DIAGNOSIS — F329 Major depressive disorder, single episode, unspecified: Secondary | ICD-10-CM | POA: Insufficient documentation

## 2018-04-08 DIAGNOSIS — Z7951 Long term (current) use of inhaled steroids: Secondary | ICD-10-CM | POA: Insufficient documentation

## 2018-04-08 MED ORDER — PREDNISONE 20 MG PO TABS: 40.0000 mg | ORAL_TABLET | Freq: Every day | ORAL | 0 refills | Status: AC

## 2018-04-08 MED FILL — predniSONE 20 MG TABS: 20 | 7 days supply | Qty: 14 | Fill #0

## 2018-04-08 NOTE — Progress Notes (Signed)
Assessment & Plan:  Colleen RakersJulissa was seen today for ear problem.  Diagnoses and all orders for this visit:  Eustachian tube disorder, left -     predniSONE (DELTASONE) 20 MG tablet; Take 2 tablets (40 mg total) by mouth daily with breakfast for 7 days.    Patient has been counseled on age-appropriate routine health concerns for screening and prevention. These are reviewed and up-to-date. Referrals have been placed accordingly. Immunizations are up-to-date or declined.    Subjective:   Chief Complaint  Patient presents with  . Ear Problem   HPI Colleen Warner 42 y.o. female presents to office today with complaints to chronic eustachian tube dysfunction. VRI was used to communicate directly with patient for the entire encounter including providing detailed patient instructions.    Ear Problem She endorses a ringing sensation and pain in the left ear.  At night when she lies down her tinnitus is worsened. She also feels like "water" is coming out of her ear however when she feels inside of her ear canal there is no fluid present. She denies any previous history of allergies or current UR symptoms. Problem has been ongoing for a few months. She has tried decongestants, nasal steroids, and antihistamines with no relief of symptoms. I have instructed her that she needs to be referred to ENT however [facility] She will call the office once she has submitted her paperwork and been approved. I will then refer her to ENT. Will try oral prednisone at this time.   Review of Systems  Constitutional: Negative for fever, malaise/fatigue and weight loss.  HENT: Positive for ear pain, hearing loss (muffled hearing) and tinnitus. Negative for congestion, ear discharge, nosebleeds, sinus pain and sore throat.   Eyes: Negative.  Negative for blurred vision, double vision and photophobia.  Respiratory: Negative.  Negative for cough, shortness of breath and  stridor.   Cardiovascular: Negative.  Negative for chest pain, palpitations and leg swelling.  Gastrointestinal: Negative.  Negative for heartburn, nausea and vomiting.  Musculoskeletal: Negative.  Negative for myalgias.  Neurological: Negative.  Negative for dizziness, focal weakness, seizures and headaches.  Psychiatric/Behavioral: Negative.  Negative for suicidal ideas.    Past Medical History:  Diagnosis Date  . Allergic rhinitis 06/09/2014  . Anemia   . Bartholin cyst 10/04/2012  . Bloating 12/23/2012  . Carpal tunnel syndrome, bilateral 06/09/2014  . Kidney stones   . LLQ abdominal pain 03/09/2015  . Mental disorder    depression  . No pertinent past medical history   . Right flank pain 04/21/2013  . Tinea corporis 01/21/2016  . UTI (urinary tract infection)     Past Surgical History:  Procedure Laterality Date  . KIDNEY STONE SURGERY      Family History  Problem Relation Age of Onset  . Cancer Father     Social History Reviewed with no changes to be made today.   Outpatient Medications Prior to Visit  Medication Sig Dispense Refill  . fluticasone (FLONASE) 50 MCG/ACT nasal spray Place 2 sprays into both nostrils daily. 16 g 6  . norethindrone-ethinyl estradiol (NORTREL 0.5/35, 28,) 0.5-35 MG-MCG tablet Take 1 tablet by mouth daily. 28 tablet 6  . Vitamin D, Ergocalciferol, (DRISDOL) 50000 units CAPS capsule Take 1 capsule (50,000 Units total) by mouth every 7 (seven) days. 12 capsule 1  . ketoconazole (NIZORAL) 2 % shampoo Apply 1 application topically 2 (two) times a week. (Patient not taking: Reported on 03/12/2018)  120 mL 1  . polyethylene glycol powder (GLYCOLAX/MIRALAX) powder Take 17 g daily as needed by mouth for mild constipation or moderate constipation. (Patient not taking: Reported on 07/05/2017) 3350 g 1  . clotrimazole (LOTRIMIN) 1 % cream Apply 1 application topically 2 (two) times a day to the lesion below the left underarm. (Patient not taking: Reported on  12/10/2017) 30 g 0  . hydrocortisone (ANUSOL-HC) 2.5 % rectal cream Place 1 application 2 (two) times daily as needed rectally for hemorrhoids or anal itching. For 7 days. (Patient not taking: Reported on 07/05/2017) 30 g 1  . ketoconazole (NIZORAL) 2 % cream Apply 1 application topically daily. (Patient not taking: Reported on 12/10/2017) 15 g 0  . metoCLOPramide (REGLAN) 10 MG tablet Take 1 tablet (10 mg total) by mouth every 6 (six) hours as needed for nausea. 30 tablet 0  . phenylephrine (SUDAFED PE) 10 MG TABS tablet 1 tab 3 times daily for 3 weeks then prn congestion (Patient not taking: Reported on 04/08/2018) 42 tablet 3  . tamsulosin (FLOMAX) 0.4 MG CAPS capsule Take 1 capsule (0.4 mg total) daily by mouth. (Patient not taking: Reported on 07/05/2017) 30 capsule 0  . tiZANidine (ZANAFLEX) 4 MG capsule Take 1 capsule (4 mg total) by mouth at bedtime. (Patient not taking: Reported on 04/08/2018) 30 capsule 1   No facility-administered medications prior to visit.     No Known Allergies     Objective:    BP 97/63   Pulse 77   Temp 97.6 F (36.4 C) (Oral)   Ht 4\' 10"  (1.473 m)   Wt 138 lb 12.8 oz (63 kg)   SpO2 99%   BMI 29.01 kg/m  Wt Readings from Last 3 Encounters:  04/08/18 138 lb 12.8 oz (63 kg)  03/12/18 140 lb (63.5 kg)  12/10/17 138 lb 12.8 oz (63 kg)    Physical Exam  Constitutional: She is oriented to person, place, and time. She appears well-developed and well-nourished. She is cooperative.  HENT:  Head: Normocephalic and atraumatic.  Right Ear: Hearing, external ear and ear canal normal. Tympanic membrane is scarred. A middle ear effusion is present. No decreased hearing is noted.  Left Ear: Ear canal normal. Tympanic membrane is scarred. A middle ear effusion is present. Decreased hearing is noted.  Nose: Mucosal edema present. No rhinorrhea or nasal deformity. Right sinus exhibits no maxillary sinus tenderness and no frontal sinus tenderness. Left sinus exhibits no  maxillary sinus tenderness and no frontal sinus tenderness.  Eyes: EOM are normal.  Neck: Normal range of motion.  Cardiovascular: Normal rate, regular rhythm, normal heart sounds and intact distal pulses. Exam reveals no gallop and no friction rub.  No murmur heard. Pulmonary/Chest: Effort normal and breath sounds normal. No tachypnea. No respiratory distress. She has no decreased breath sounds. She has no wheezes. She has no rhonchi. She has no rales. She exhibits no tenderness.  Abdominal: Soft. Bowel sounds are normal.  Musculoskeletal: Normal range of motion. She exhibits no edema.  Neurological: She is alert and oriented to person, place, and time. Coordination normal.  Skin: Skin is warm and dry.  Psychiatric: She has a normal mood and affect. Her behavior is normal. Judgment and thought content normal.  Nursing note and vitals reviewed.        Patient has been counseled extensively about nutrition and exercise as well as the importance of adherence with medications and regular follow-up. The patient was given clear instructions to go to ER  or return to medical center if symptoms don't improve, worsen or new problems develop. The patient verbalized understanding.   Follow-up: Return if symptoms worsen or fail to improve.   Claiborne Rigg, FNP-BC [facility] and [facility] [place], [place] [phone]   04/08/2018, 11:57 AM

## 2018-04-08 NOTE — Patient Instructions (Signed)
Disfuncin de la trompa de Eustaquio (Eustachian Tube Dysfunction) La trompa de Eustaquio conecta el odo medio con la parte posterior de la nariz. Regula la presin de aire en el odo medio al permitir que el aire circule por el odo y la nariz. Tambin ayuda a drenar el lquido del espacio del odo medio. Cuando la trompa de Eustaquio no funciona bien, se puede producir una acumulacin de presin de aire, lquido o ambos en el odo medio. La disfuncin de la trompa de Eustaquio puede afectar a uno o a los dos odos. CAUSAS Esta afeccin ocurre cuando la trompa de Eustaquio se bloquea o no puede abrirse normalmente. Puede ser consecuencia de lo siguiente:  Infecciones en los odos.  Resfriados y otras infecciones de las vas respiratorias superiores.  Alergias.  Irritacin, por ejemplo, por el humo del cigarrillo o los cidos del estmago que vuelven hacia el esfago (reflujo gastroesofgico).  Cambios sbitos en la presin del aire, como cuando baja un avin.  Crecimientos anormales en la nariz o la garganta, como plipos nasales, tumores o tejido engrosado en la parte posterior de la garganta (adenoides). FACTORES DE RIESGO Puede ser ms probable que esta afeccin se desarrolle en las personas que fuman y las personas que tienen sobrepeso. Tambin es ms probable que la disfuncin de la trompa de Eustaquio se produzca en los nios, especialmente los nios que presentan lo siguiente:  Ciertos defectos congnitos en la boca, como fisura del paladar.  Amgdalas y adenoides grandes. SNTOMAS Los sntomas de esta afeccin pueden incluir lo siguiente:  Sensacin de que el odo est tapado.  Dolor de odo.  Ruidos como chasquidos o crujidos en el odo.  Zumbidos en el odo.  Prdida auditiva.  Prdida del equilibrio. Los sntomas pueden empeorar cuando la presin que tiene a su alrededor cambia, como cuando viaja a una zona de mayor altura o en avin. DIAGNSTICO Esta afeccin se  puede diagnosticar en funcin de lo siguiente:  Sus sntomas.  Un examen fsico del odo, de la nariz y de la garganta.  Pruebas en las que se determine lo siguiente: ? El movimiento de la membrana del tmpano (timpanograma). ? La audicin (audiometra). TRATAMIENTO El tratamiento depende de la causa y de la gravedad de la afeccin. Si los sntomas son leves, es posible que pueda aliviarlos haciendo circular aire ("destapar") dentro de los odos. Si tiene sntomas de lquido en los odos, el tratamiento puede incluir lo siguiente:  Descongestivos.  Antihistamnicos.  Aerosoles nasales o gotas para los odos que contengan medicamentos para reducir la hinchazn (corticoides). En algunos casos, puede necesitar un procedimiento para drenar el lquido de la membrana del tmpano (miringotoma). En este procedimiento, se coloca un tubo pequeo en la membrana del tmpano para hacer lo siguiente:  Drenar el lquido.  Restablecer el aire en el espacio del odo medio. INSTRUCCIONES PARA EL CUIDADO EN EL HOGAR  Tome los medicamentos de venta libre y los recetados solamente como se lo haya indicado el mdico.  Utilice las tcnicas recomendadas por el mdico para ayudar a destapar los odos. Estas pueden incluir las siguientes: ? Masticar goma de mascar. ? Bostezos. ? Tragar vigorosamente con frecuencia. ? Cerrar la boca, taparse la nariz y soplar suavemente por la nariz como si tratara de soltar el aire.  No haga ninguna de estas cosas hasta que el mdico lo autorice: ? Viajar a grandes alturas. ? Viajar en avin. ? Trabajar en una cabina o una habitacin presurizada. ? Practicar buceo.  Mantener   secos los odos. Squese bien los odos despus de ducharse o darse un bao.  No fume.  Concurra a todas las visitas de control como se lo haya indicado el mdico. Esto es importante. SOLICITE ATENCIN MDICA SI:  Los sntomas no desaparecen despus del tratamiento.  Los sntomas regresan  despus del tratamiento.  No puede destaparse los odos.  Tiene los siguientes sntomas: ? Grant RutsFiebre. ? Dolor en el odo. ? Dolor de cabeza o en el cuello. ? Hay lquido que sale del odo.  La audicin cambia de repente.  Se siente muy mareado.  Pierde el equilibrio. Esta informacin no tiene Theme park managercomo fin reemplazar el consejo del mdico. Asegrese de hacerle al mdico cualquier pregunta que tenga. Document Released: 12/15/2015 Document Revised: 12/15/2015 Document Reviewed: 05/06/2014 Elsevier Interactive Patient Education  [employer]2018 Elsevier Inc.

## 2018-04-08 NOTE — Progress Notes (Signed)
Patient stated she is still having pain in ear at night.

## 2018-04-10 ENCOUNTER — Other Ambulatory Visit (HOSPITAL_COMMUNITY): Payer: Self-pay | Admitting: *Deleted

## 2018-04-10 DIAGNOSIS — Z1231 Encounter for screening mammogram for malignant neoplasm of breast: Secondary | ICD-10-CM

## 2018-04-29 MED FILL — NORTREL 0.5-35 TABLET: 0.5-35 | 28 days supply | Qty: 28 | Fill #0

## 2018-05-24 MED FILL — NORTREL 0.5-35 TABLET: 0.5-35 | 28 days supply | Qty: 28 | Fill #1

## 2018-06-24 MED FILL — NORTREL 0.5-35 TABLET: 0.5-35 | 28 days supply | Qty: 28 | Fill #2

## 2018-07-02 ENCOUNTER — Ambulatory Visit
Admission: RE | Admit: 2018-07-02 | Discharge: 2018-07-02 | Disposition: A | Discharge status: Home / Self Care | Payer: Self-pay | Source: Ambulatory Visit | Attending: Obstetrics and Gynecology | Admitting: Obstetrics and Gynecology

## 2018-07-02 ENCOUNTER — Ambulatory Visit (HOSPITAL_COMMUNITY): Payer: Self-pay

## 2018-07-02 DIAGNOSIS — Z1231 Encounter for screening mammogram for malignant neoplasm of breast: Secondary | ICD-10-CM

## 2018-07-16 MED FILL — NORTREL 0.5-35 TABLET: 0.5-35 | 28 days supply | Qty: 28 | Fill #3

## 2018-07-16 MED FILL — VIT D2 1.25 MG (50,000 UNIT: 1.25 MG | 28 days supply | Qty: 4 | Fill #4

## 2018-08-14 MED FILL — NORTREL 0.5-35 TABLET: 0.5-35 | 28 days supply | Qty: 28 | Fill #4

## 2018-08-16 ENCOUNTER — Other Ambulatory Visit: Payer: Self-pay

## 2018-08-16 ENCOUNTER — Telehealth: Payer: Self-pay | Admitting: Nurse Practitioner

## 2018-08-16 ENCOUNTER — Encounter (HOSPITAL_COMMUNITY): Payer: Self-pay

## 2018-08-16 ENCOUNTER — Ambulatory Visit (HOSPITAL_COMMUNITY)
Admission: EM | Admit: 2018-08-16 | Discharge: 2018-08-16 | Disposition: A | Discharge status: Home / Self Care | Payer: PRIVATE HEALTH INSURANCE | Attending: Family Medicine | Admitting: Family Medicine

## 2018-08-16 DIAGNOSIS — R109 Unspecified abdominal pain: Secondary | ICD-10-CM | POA: Insufficient documentation

## 2018-08-16 DIAGNOSIS — N200 Calculus of kidney: Secondary | ICD-10-CM

## 2018-08-16 DIAGNOSIS — R319 Hematuria, unspecified: Secondary | ICD-10-CM | POA: Insufficient documentation

## 2018-08-16 LAB — POCT URINALYSIS DIP (DEVICE)
Bilirubin Urine: NEGATIVE
Glucose, UA: NEGATIVE mg/dL
Ketones, ur: NEGATIVE mg/dL
Leukocytes,Ua: NEGATIVE
Nitrite: NEGATIVE
Protein, ur: NEGATIVE mg/dL
Specific Gravity, Urine: 1.02 (ref 1.005–1.030)
Urobilinogen, UA: 0.2 mg/dL (ref 0.0–1.0)
pH: 7 (ref 5.0–8.0)

## 2018-08-16 MED ORDER — TRAMADOL HCL 50 MG PO TABS: 50.0000 mg | ORAL_TABLET | Freq: Four times a day (QID) | ORAL | 0 refills | Status: DC | PRN

## 2018-08-16 MED ORDER — TAMSULOSIN HCL 0.4 MG PO CAPS: 0.4000 mg | ORAL_CAPSULE | Freq: Every day | ORAL | 0 refills | Status: DC

## 2018-08-16 MED FILL — TAMSULOSIN HCL 0.4 MG CAP: 0.4 | 30 days supply | Qty: 30 | Fill #0

## 2018-08-16 MED FILL — traMADol HCL 50 MG TABS: 50 | 4 days supply | Qty: 15 | Fill #0

## 2018-08-16 NOTE — Telephone Encounter (Signed)
Patients call taken.  Patient identified by name and date of birth.  Patient states that starting last night she began having pain on the left side in the area of the kidney as well as the lung and chest area,  Pain is a burning sensation.  Pain is constant.  Pain number is 8/10.  Patient advised to go to Urgent Care for evaluation.  Patient acknowledged understanding of advise.

## 2018-08-16 NOTE — Telephone Encounter (Signed)
Patient called stating she has left kidney pain and chest pain. Please follow up.

## 2018-08-16 NOTE — ED Provider Notes (Signed)
[facility]    CSN: [id number] Arrival date & time: 08/16/18  1431     History   Chief Complaint Chief Complaint  Patient presents with  . Flank Pain    HPI Colleen Warner is a 43 y.o. female.   HPI.  Patient has a history of left-sided nephrolithiasis.  It is been treated conservatively.  She states she is had a recurrence of pain for the last 2 days.  Tender left flank.  It radiates into her left abdomen.  No pain with deep breath.  No pain with movement.  No lifting or trauma.  Difficult to tell if she has hematuria because she currently on her menses.  No dysuria or frequency.  No fever chills.  No nausea vomiting. Spanish interpreter via Internet/Axel  Past Medical History:  Diagnosis Date  . Allergic rhinitis 06/09/2014  . Anemia   . Bartholin cyst 10/04/2012  . Bloating 12/23/2012  . Carpal tunnel syndrome, bilateral 06/09/2014  . Kidney stones   . LLQ abdominal pain 03/09/2015  . Mental disorder    depression  . No pertinent past medical history   . Right flank pain 04/21/2013  . Tinea corporis 01/21/2016  . UTI (urinary tract infection)     Patient Active Problem List   Diagnosis Date Noted  . Hematuria, microscopic 09/17/2016  . Irregular menstrual cycle 01/21/2016  . OCP (oral contraceptive pills) initiation 01/21/2016  . Tinea capitis 01/21/2016  . LLQ abdominal pain 03/09/2015  . Decreased visual acuity 06/09/2014  . Allergic rhinitis 06/09/2014  . Carpal tunnel syndrome, bilateral 06/09/2014  . Right flank pain 04/21/2013  . Bloating 12/23/2012  . Bartholin cyst 10/04/2012  . Depression 12/16/2010    Past Surgical History:  Procedure Laterality Date  . KIDNEY STONE SURGERY      OB History    Gravida  3   Para  3   Term  3   Preterm      AB      Living  3     SAB      TAB      Ectopic      Multiple      Live Births  3            Home Medications    Prior to Admission medications   Medication Sig  Start Date End Date Taking? Authorizing Provider  fluticasone (FLONASE) 50 MCG/ACT nasal spray Place 2 sprays into both nostrils daily. 03/12/18   Claiborne Rigg, NP  norethindrone-ethinyl estradiol (NORTREL 0.5/35, 28,) 0.5-35 MG-MCG tablet Take 1 tablet by mouth daily. 03/12/18   Claiborne Rigg, NP  tamsulosin (FLOMAX) 0.4 MG CAPS capsule Take 1 capsule (0.4 mg total) by mouth daily after supper. 08/16/18   Eustace Moore, MD  traMADol (ULTRAM) 50 MG tablet Take 1 tablet (50 mg total) by mouth every 6 (six) hours as needed. 08/16/18   Eustace Moore, MD  Vitamin D, Ergocalciferol, (DRISDOL) 50000 units CAPS capsule Take 1 capsule (50,000 Units total) by mouth every 7 (seven) days. 12/13/17   Claiborne Rigg, NP    Family History Family History  Problem Relation Age of Onset  . Cancer Father     Social History Social History   Tobacco Use  . Smoking status: Never Smoker  . Smokeless tobacco: Never Used  Substance Use Topics  . Alcohol use: No    Comment: ETOH with pregnancy  . Drug use: No     Allergies  Patient has no known allergies.   Review of Systems Review of Systems  Constitutional: Negative for chills and fever.  HENT: Negative for ear pain and sore throat.   Eyes: Negative for pain and visual disturbance.  Respiratory: Negative for cough and shortness of breath.   Cardiovascular: Negative for chest pain and palpitations.  Gastrointestinal: Negative for abdominal pain and vomiting.  Genitourinary: Positive for flank pain and hematuria. Negative for dysuria and frequency.  Musculoskeletal: Negative for arthralgias and back pain.  Skin: Negative for color change and rash.  Neurological: Negative for seizures and syncope.  All other systems reviewed and are negative.    Physical Exam Triage Vital Signs ED Triage Vitals  Enc Vitals Group     BP 08/16/18 1514 112/80     Pulse Rate 08/16/18 1514 89     Resp 08/16/18 1514 17     Temp 08/16/18 1514  98.2 F (36.8 C)     Temp Source 08/16/18 1514 Oral     SpO2 08/16/18 1514 100 %     Weight --      Height --      Head Circumference --      Peak Flow --      Pain Score 08/16/18 1513 7     Pain Loc --      Pain Edu? --      Excl. in GC? --    No data found.  Updated Vital Signs BP 112/80 (BP Location: Left Arm)   Pulse 89   Temp 98.2 F (36.8 C) (Oral)   Resp 17   SpO2 100%  :     Physical Exam Constitutional:      General: She is not in acute distress.    Appearance: Normal appearance. She is well-developed.  HENT:     Head: Normocephalic and atraumatic.  Eyes:     Conjunctiva/sclera: Conjunctivae normal.     Pupils: Pupils are equal, round, and reactive to light.  Neck:     Musculoskeletal: Normal range of motion.  Cardiovascular:     Rate and Rhythm: Normal rate.  Pulmonary:     Effort: Pulmonary effort is normal. No respiratory distress.  Abdominal:     General: Abdomen is flat. There is no distension.     Palpations: Abdomen is soft.     Tenderness: There is abdominal tenderness.     Comments: Tenderness to percussion of left flank.  Tenderness to deep palpation of left abdomen.  Musculoskeletal: Normal range of motion.     Comments: No tenderness to palpation over the muscles or bony spinous processes of the lumbar spine.  Strength sensation range of motion reflexes are normal in both lower extremities.  Skin:    General: Skin is warm and dry.  Neurological:     Mental Status: She is alert.      UC Treatments / Results  Labs (all labs ordered are listed, but only abnormal results are displayed) Labs Reviewed  POCT URINALYSIS DIP (DEVICE) - Abnormal; Notable for the following components:      Result Value   Hgb urine dipstick SMALL (*)    All other components within normal limits  URINE CULTURE    EKG None  Radiology No results found.  Procedures Procedures (including critical care time)  Medications Ordered in UC Medications - No data  to display  Initial Impression / Assessment and Plan / UC Course  I have reviewed the triage vital signs and the nursing notes.  Pertinent labs & imaging results that were available during my care of the patient were reviewed by me and considered in my medical decision making (see chart for details).     Clinically I believe the patient has a recurrence of her kidney stone.  Push fluids.  Tamsulosin.  Tylenol as needed pain.  Call family practice if not better by Monday Final Clinical Impressions(s) / UC Diagnoses   Final diagnoses:  Left flank pain  Hematuria, unspecified type  Nephrolithiasis     Discharge Instructions     Take the tamsulosin once a day This will help the kidney stone passed Drink lots of water Take tramadol for pain Call your doctor if not better by Monday Go to ER if you become much worse   ED Prescriptions    Medication Sig Dispense Auth. Provider   traMADol (ULTRAM) 50 MG tablet Take 1 tablet (50 mg total) by mouth every 6 (six) hours as needed. 15 tablet Eustace MooreNelson, [place] Daisey Sue, MD   tamsulosin (FLOMAX) 0.4 MG CAPS capsule Take 1 capsule (0.4 mg total) by mouth daily after supper. 30 capsule Eustace MooreNelson, Burtis Imhoff Sue, MD     Controlled Substance Prescriptions [place] Controlled Substance Registry consulted? Yes, I have consulted the [place] Controlled Substances Registry for this patient, and feel the risk/benefit ratio today is favorable for proceeding with this prescription for a controlled substance.   Eustace MooreNelson, Eladia Frame Sue, MD 08/16/18 1714

## 2018-08-16 NOTE — Discharge Instructions (Addendum)
Take the tamsulosin once a day This will help the kidney stone passed Drink lots of water Take tramadol for pain Call your doctor if not better by Monday Go to ER if you become much worse

## 2018-08-16 NOTE — ED Triage Notes (Signed)
Patient presents to Urgent Care with complaints of left flank pain since 2 days ago. Patient states she has hx of kidney stones.

## 2018-08-16 NOTE — ED Notes (Signed)
Patient verbalizes understanding of discharge instructions. Opportunity for questioning and answers were provided. Patient discharged from [facility] by RN.  

## 2018-08-17 LAB — URINE CULTURE: Culture: NO GROWTH

## 2018-08-19 ENCOUNTER — Ambulatory Visit: Payer: PRIVATE HEALTH INSURANCE | Admitting: Family Medicine

## 2018-08-19 ENCOUNTER — Telehealth: Payer: Self-pay | Admitting: *Deleted

## 2018-08-19 NOTE — Telephone Encounter (Signed)
Called [employer] interpreter and had them call patient on 08/16/18 to remind patient of her appointment on 08/19/18. A message was left on the mobile number and patient home number did not have a voicemail and stated that this line is busy. Called [employer] interpreter this morning 08-19-18 and the same thing happen as Friday and message was left on mobile phone.

## 2018-09-03 ENCOUNTER — Telehealth: Payer: Self-pay | Admitting: Nurse Practitioner

## 2018-09-03 NOTE — Telephone Encounter (Signed)
Patient called stating she has fungus again and would like to get the medication for the fungus patient states she was given the medication at the pharmacy Please follow up.

## 2018-09-03 NOTE — Telephone Encounter (Signed)
Will route to PCP 

## 2018-09-11 MED FILL — NORTREL 0.5-35 TABLET: 0.5-35 | 28 days supply | Qty: 28 | Fill #5

## 2018-10-14 MED FILL — NORTREL 0.5-35 TABLET: 0.5-35 | 28 days supply | Qty: 28 | Fill #6

## 2018-11-04 ENCOUNTER — Other Ambulatory Visit: Payer: Self-pay | Admitting: Nurse Practitioner

## 2018-11-04 DIAGNOSIS — Z3041 Encounter for surveillance of contraceptive pills: Secondary | ICD-10-CM

## 2018-11-04 MED FILL — VIT D2 1.25 MG (50,000 UNIT: 1.25 MG | 28 days supply | Qty: 4 | Fill #5

## 2018-11-05 MED FILL — NORTREL 0.5-35 TABLET: 0.5-35 | 28 days supply | Qty: 28 | Fill #0

## 2018-12-05 MED FILL — NORTREL 0.5-35 TABLET: 0.5-35 | 28 days supply | Qty: 28 | Fill #1

## 2019-01-03 MED FILL — NORTREL 0.5-35 TABLET: 0.5-35 | 28 days supply | Qty: 28 | Fill #2

## 2019-01-15 ENCOUNTER — Telehealth: Payer: Self-pay | Admitting: Nurse Practitioner

## 2019-01-15 ENCOUNTER — Other Ambulatory Visit: Payer: Self-pay | Admitting: Nurse Practitioner

## 2019-01-15 DIAGNOSIS — Z3041 Encounter for surveillance of contraceptive pills: Secondary | ICD-10-CM

## 2019-01-15 MED ORDER — NORTREL 0.5/35 (28) 0.5-35 MG-MCG PO TABS: 1.0000 | ORAL_TABLET | Freq: Every day | ORAL | 10 refills | Status: DC

## 2019-01-15 NOTE — Telephone Encounter (Signed)
1) Medication(s) Requested (by name): -NORTREL 0.5/35, 28, 0.5-35 MG-MCG tablet   2) Pharmacy of Choice: -[facility], [place] [address]

## 2019-01-31 MED FILL — NORTREL 0.5-35 TABLET: 0.5-35 | 28 days supply | Qty: 28 | Fill #0

## 2019-02-07 ENCOUNTER — Encounter: Payer: Self-pay | Admitting: Nurse Practitioner

## 2019-02-12 ENCOUNTER — Other Ambulatory Visit: Payer: Self-pay

## 2019-02-12 ENCOUNTER — Ambulatory Visit: Payer: Self-pay | Attending: Family Medicine

## 2019-02-24 MED FILL — NORTREL 0.5-35 TABLET: 0.5-35 | 28 days supply | Qty: 28 | Fill #1

## 2019-02-25 ENCOUNTER — Telehealth: Payer: Self-pay | Admitting: Nurse Practitioner

## 2019-02-25 NOTE — Telephone Encounter (Signed)
I called Pt, LVM inform her that we received her an will be check in to make sure everything is complete

## 2019-02-25 NOTE — Telephone Encounter (Signed)
Will route to financial assistant.

## 2019-02-25 NOTE — Telephone Encounter (Signed)
Patient called wanting to know if she can talk to your about her cafa documents. Please follow up.

## 2019-03-11 ENCOUNTER — Ambulatory Visit: Payer: Self-pay | Attending: Nurse Practitioner | Admitting: Nurse Practitioner

## 2019-03-11 ENCOUNTER — Other Ambulatory Visit: Payer: Self-pay

## 2019-03-11 ENCOUNTER — Encounter: Payer: Self-pay | Admitting: Nurse Practitioner

## 2019-03-11 VITALS — BP 108/76 | HR 77 | Temp 98.2°F | Ht <= 58 in | Wt 142.2 lb

## 2019-03-11 DIAGNOSIS — B354 Tinea corporis: Secondary | ICD-10-CM

## 2019-03-11 DIAGNOSIS — E559 Vitamin D deficiency, unspecified: Secondary | ICD-10-CM

## 2019-03-11 DIAGNOSIS — Z13228 Encounter for screening for other metabolic disorders: Secondary | ICD-10-CM

## 2019-03-11 DIAGNOSIS — M79604 Pain in right leg: Secondary | ICD-10-CM

## 2019-03-11 DIAGNOSIS — Z130 Encounter for screening for diseases of the blood and blood-forming organs and certain disorders involving the immune mechanism: Secondary | ICD-10-CM

## 2019-03-11 DIAGNOSIS — Z0000 Encounter for general adult medical examination without abnormal findings: Secondary | ICD-10-CM

## 2019-03-11 DIAGNOSIS — M79605 Pain in left leg: Secondary | ICD-10-CM

## 2019-03-11 DIAGNOSIS — Z13220 Encounter for screening for lipoid disorders: Secondary | ICD-10-CM

## 2019-03-11 MED ORDER — CLOTRIMAZOLE 1 % EX CREA: 1.0000 "application " | TOPICAL_CREAM | Freq: Two times a day (BID) | CUTANEOUS | 1 refills | Status: DC

## 2019-03-11 MED ORDER — VITAMIN D (ERGOCALCIFEROL) 1.25 MG (50000 UNIT) PO CAPS: 50000.0000 [IU] | ORAL_CAPSULE | ORAL | 1 refills | Status: DC

## 2019-03-11 MED FILL — VIT D2 1.25 MG (50,000 UNIT: 1.25 MG | 84 days supply | Qty: 12 | Fill #0

## 2019-03-11 NOTE — Progress Notes (Signed)
Assessment & Plan:  Diagnoses and all orders for this visit:  Encounter for annual physical exam  Bilateral leg pain -     CBC -     CMP14+EGFR -     Vitamin D level  Vitamin D deficiency disease -     Vitamin D level -     Vitamin D, Ergocalciferol, (DRISDOL) 1.25 MG (50000 UT) CAPS capsule; Take 1 capsule (50,000 Units total) by mouth every 7 (seven) days. Please fill as a 90 day supply  Screening for deficiency anemia -     CBC  Screening for metabolic disorder -     DVV61+YWVP  Lipid screening -     Lipid Panel  Tinea corporis -     clotrimazole (LOTRIMIN) 1 % cream; Apply 1 application topically 2 (two) times daily. -     ketoconazole (NIZORAL) 2 % shampoo; Apply 1 application topically 2 (two) times a week.    Patient has been counseled on age-appropriate routine health concerns for screening and prevention. These are reviewed and up-to-date. Referrals have been placed accordingly. Immunizations are up-to-date or declined.    Subjective:  No chief complaint on file.  HPI Colleen Warner 43 y.o. female presents to office today for annual physical exam.   Tinea: Patient has complaints of persistent tinea corporis. Lesions are located on the right [facility] space and over the scalp and around the hair line. Symptoms include patches of scaly skin. Symptoms have been ongoing for about 1.5 years. Previous evaluation and treatment has included prescription topical antifungal per med orders: effective oral antifungal per med orders: effective with good improvement.  Patient denies alcohol overuse, fever, hematemesis and melena.  Review of Systems  Constitutional: Negative for fever, malaise/fatigue and weight loss.  HENT: Negative.  Negative for nosebleeds.   Eyes: Negative.  Negative for blurred vision, double vision and photophobia.  Respiratory: Negative.  Negative for cough and shortness of breath.   Cardiovascular: Negative.  Negative for chest pain, palpitations  and leg swelling.  Gastrointestinal: Negative.  Negative for heartburn, nausea and vomiting.  Genitourinary: Negative.   Musculoskeletal: Positive for myalgias (bilateral leg pain; nocturnal).  Skin: Positive for itching and rash.  Neurological: Negative.  Negative for dizziness, focal weakness, seizures and headaches.  Endo/Heme/Allergies: Positive for environmental allergies.  Psychiatric/Behavioral: Negative.  Negative for suicidal ideas.    Past Medical History:  Diagnosis Date  . Allergic rhinitis 06/09/2014  . Anemia   . Bartholin cyst 10/04/2012  . Bloating 12/23/2012  . Carpal tunnel syndrome, bilateral 06/09/2014  . Kidney stones   . LLQ abdominal pain 03/09/2015  . Mental disorder    depression  . No pertinent past medical history   . Right flank pain 04/21/2013  . Tinea corporis 01/21/2016  . UTI (urinary tract infection)     Past Surgical History:  Procedure Laterality Date  . KIDNEY STONE SURGERY      Family History  Problem Relation Age of Onset  . Cancer Father     Social History Reviewed with no changes to be made today.   Outpatient Medications Prior to Visit  Medication Sig Dispense Refill  . fluticasone (FLONASE) 50 MCG/ACT nasal spray Place 2 sprays into both nostrils daily. 16 g 6  . norethindrone-ethinyl estradiol (NORTREL 0.5/35, 28,) 0.5-35 MG-MCG tablet Take 1 tablet by mouth daily. 28 tablet 10  . tamsulosin (FLOMAX) 0.4 MG CAPS capsule Take 1 capsule (0.4 mg total) by mouth daily after supper. 30 capsule 0  .  traMADol (ULTRAM) 50 MG tablet Take 1 tablet (50 mg total) by mouth every 6 (six) hours as needed. 15 tablet 0  . Vitamin D, Ergocalciferol, (DRISDOL) 50000 units CAPS capsule Take 1 capsule (50,000 Units total) by mouth every 7 (seven) days. 12 capsule 1   No facility-administered medications prior to visit.     No Known Allergies     Objective:    BP 108/76 (BP Location: Right Arm, Patient Position: Sitting, Cuff Size: Large)   Pulse 77    Temp 98.2 F (36.8 C) (Oral)   Ht _0  (1.473 m)   Wt 142 lb 3.2 oz (64.5 kg)   SpO2 99%   BMI 29.72 kg/m  Wt Readings from Last 3 Encounters:  03/11/19 142 lb 3.2 oz (64.5 kg)  04/08/18 138 lb 12.8 oz (63 kg)  03/12/18 140 lb (63.5 kg)    Physical Exam Constitutional:      Appearance: She is well-developed.  HENT:     Head: Normocephalic and atraumatic.     Right Ear: External ear normal.     Left Ear: External ear normal.     Nose: Nose normal.     Mouth/Throat:     Pharynx: No oropharyngeal exudate.  Eyes:     General: No scleral icterus.       Right eye: No discharge.     Conjunctiva/sclera: Conjunctivae normal.     Pupils: Pupils are equal, round, and reactive to light.  Neck:     Musculoskeletal: Normal range of motion and neck supple.     Thyroid: No thyromegaly.     Trachea: No tracheal deviation.  Cardiovascular:     Rate and Rhythm: Normal rate and regular rhythm.     Heart sounds: Normal heart sounds. No murmur. No friction rub.  Pulmonary:     Effort: Pulmonary effort is normal. No accessory muscle usage or respiratory distress.     Breath sounds: Normal breath sounds. No decreased breath sounds, wheezing, rhonchi or rales.  Chest:     Chest wall: No tenderness.     Breasts: Breasts are symmetrical.        Right: No inverted nipple, mass, nipple discharge, skin change or tenderness.        Left: No inverted nipple, mass, nipple discharge, skin change or tenderness.  Abdominal:     General: Bowel sounds are normal. There is no distension.     Palpations: Abdomen is soft. There is no mass.     Tenderness: There is no abdominal tenderness. There is no guarding or rebound.  Musculoskeletal: Normal range of motion.        General: No tenderness or deformity.  Lymphadenopathy:     Cervical: No cervical adenopathy.  Skin:    General: Skin is warm and dry.     Findings: Rash present. No erythema. Rash is macular.       Neurological:     Mental Status:  She is alert and oriented to person, place, and time.     Cranial Nerves: No cranial nerve deficit.     Sensory: Sensation is intact.     Motor: Motor function is intact.     Coordination: Coordination is intact. Coordination normal.     Gait: Gait is intact.     Deep Tendon Reflexes: Reflexes are normal and symmetric.     Reflex Scores:      Patellar reflexes are 2+ on the right side and 2+ on the left side. Psychiatric:  Speech: Speech normal.        Behavior: Behavior normal.        Thought Content: Thought content normal.        Judgment: Judgment normal.          Patient has been counseled extensively about nutrition and exercise as well as the importance of adherence with medications and regular follow-up. The patient was given clear instructions to go to ER or return to medical center if symptoms don't improve, worsen or new problems develop. The patient verbalized understanding.   Follow-up: Return if symptoms worsen or fail to improve.   [name], FNP-BC [facility] [place], [place]

## 2019-03-11 NOTE — Progress Notes (Signed)
Physical

## 2019-03-12 ENCOUNTER — Telehealth: Payer: Self-pay | Admitting: *Deleted

## 2019-03-12 LAB — CMP14+EGFR
ALT: 11 IU/L (ref 0–32)
AST: 15 IU/L (ref 0–40)
Albumin/Globulin Ratio: 1.7 (ref 1.2–2.2)
Albumin: 4 g/dL (ref 3.8–4.8)
Alkaline Phosphatase: 74 IU/L (ref 39–117)
BUN/Creatinine Ratio: 13 (ref 9–23)
BUN: 9 mg/dL (ref 6–24)
Bilirubin Total: 0.3 mg/dL (ref 0.0–1.2)
CO2: 23 mmol/L (ref 20–29)
Calcium: 9.2 mg/dL (ref 8.7–10.2)
Chloride: 104 mmol/L (ref 96–106)
Creatinine, Ser: 0.69 mg/dL (ref 0.57–1.00)
GFR calc Af Amer: 122 mL/min/{1.73_m2} (ref >59)
GFR calc non Af Amer: 105 mL/min/{1.73_m2} (ref >59)
Globulin, Total: 2.4 g/dL (ref 1.5–4.5)
Glucose: 104 mg/dL — ABNORMAL HIGH (ref 65–99)
Potassium: 3.7 mmol/L (ref 3.5–5.2)
Sodium: 141 mmol/L (ref 134–144)
Total Protein: 6.4 g/dL (ref 6.0–8.5)

## 2019-03-12 LAB — CBC
Hematocrit: 35.1 % (ref 34.0–46.6)
Hemoglobin: 11.5 g/dL (ref 11.1–15.9)
MCH: 28.2 pg (ref 26.6–33.0)
MCHC: 32.8 g/dL (ref 31.5–35.7)
MCV: 86 fL (ref 79–97)
Platelets: 248 10*3/uL (ref 150–450)
RBC: 4.08 x10E6/uL (ref 3.77–5.28)
RDW: 12.9 % (ref 11.7–15.4)
WBC: 7.7 10*3/uL (ref 3.4–10.8)

## 2019-03-12 LAB — LIPID PANEL
Chol/HDL Ratio: 3.5 ratio (ref 0.0–4.4)
Cholesterol, Total: 151 mg/dL (ref 100–199)
HDL: 43 mg/dL (ref >39)
LDL Chol Calc (NIH): 69 mg/dL (ref 0–99)
Triglycerides: 237 mg/dL — ABNORMAL HIGH (ref 0–149)
VLDL Cholesterol Cal: 39 mg/dL (ref 5–40)

## 2019-03-12 LAB — VITAMIN D 25 HYDROXY (VIT D DEFICIENCY, FRACTURES): Vit D, 25-Hydroxy: 19.6 ng/mL — ABNORMAL LOW (ref 30.0–100.0)

## 2019-03-12 NOTE — Telephone Encounter (Signed)
Pt name and DOB verified. Patient aware of results and result note per PCP Geryl Rankins.    She is taking Terbinfine HCL 250 mg for hair loss. Please send to [facility].

## 2019-03-12 NOTE — Telephone Encounter (Signed)
-----   Message from [name], NP sent at 03/12/2019 10:13 AM EST ----- Labs did not show any anemia.  Glucose is normal and does not show any diabetes.  Kidney and liver function are normal.  Vitamin D is still low.  After you finish your prescription vitamin D D would recommend you take vitamin D 2000 units daily over-the-counter.  Triglyceride cholesterol levels are still high and likely related to diet. INSTRUCTIONS: Work on a low fat, heart healthy diet and participate in regular aerobic exercise program by working out at least 150 minutes per week; 5 days a week-30 minutes per day. Avoid red meat/beef/steak,  fried foods. junk foods, sodas, sugary drinks, unhealthy snacking, alcohol and smoking.  Drink at least 80 oz of water per day and monitor your carbohydrate intake daily.

## 2019-03-16 NOTE — Telephone Encounter (Signed)
Refill request needs to be sent to prescriber. I have not prescribed this for her

## 2019-03-17 ENCOUNTER — Encounter: Payer: Self-pay | Admitting: Nurse Practitioner

## 2019-03-17 MED ORDER — KETOCONAZOLE 2 % EX SHAM
1.0000 | MEDICATED_SHAMPOO | CUTANEOUS | 1 refills | Status: DC
Start: 2019-03-17 — End: 2020-01-17

## 2019-03-17 NOTE — Telephone Encounter (Signed)
She will need to contact dermatology. I do not prescribe lamisil pills as they can affect the liver over time.

## 2019-03-17 NOTE — Telephone Encounter (Signed)
I have prescribed her a shampoo that she can use in her hair for any fungal infection.

## 2019-03-17 NOTE — Telephone Encounter (Signed)
Upon calling the patient to inform of her results, she stated that her PCP told her to call back with the name of the name of the medication she was was taking for hair loss.

## 2019-03-18 MED FILL — KETOCONAZOLE 2% SHAMPOO: 2 | 30 days supply | Qty: 120 | Fill #0

## 2019-03-18 NOTE — Telephone Encounter (Signed)
Left message on voicemail that patient can pick up medication at the pharmacy.

## 2019-03-20 ENCOUNTER — Other Ambulatory Visit: Payer: Self-pay | Admitting: Nurse Practitioner

## 2019-03-20 DIAGNOSIS — H6992 Unspecified Eustachian tube disorder, left ear: Secondary | ICD-10-CM

## 2019-03-20 MED FILL — NORTREL 0.5-35 TABLET: 0.5-35 | 28 days supply | Qty: 28 | Fill #2

## 2019-03-20 MED FILL — FLUTICASONE PROP 50 MCG SPR: 50 | 30 days supply | Qty: 16 | Fill #0

## 2019-04-21 MED FILL — NORTREL 0.5-35 TABLET: 0.5-35 | 28 days supply | Qty: 28 | Fill #3

## 2019-05-20 MED FILL — NORTREL 0.5-35 TABLET: 0.5-35 | 28 days supply | Qty: 28 | Fill #4

## 2019-06-19 MED FILL — NORTREL 0.5-35 TABLET: 0.5-35 | 28 days supply | Qty: 28 | Fill #5

## 2019-07-21 MED FILL — NORTREL 0.5-35 TABLET: 0.5-35 | 28 days supply | Qty: 28 | Fill #6

## 2019-08-06 ENCOUNTER — Encounter: Payer: Self-pay | Admitting: Emergency Medicine

## 2019-08-06 ENCOUNTER — Other Ambulatory Visit: Payer: Self-pay

## 2019-08-06 ENCOUNTER — Ambulatory Visit
Admission: EM | Admit: 2019-08-06 | Discharge: 2019-08-06 | Disposition: A | Discharge status: Home / Self Care | Payer: Self-pay | Attending: Emergency Medicine | Admitting: Emergency Medicine

## 2019-08-06 DIAGNOSIS — R079 Chest pain, unspecified: Secondary | ICD-10-CM

## 2019-08-06 NOTE — Discharge Instructions (Signed)
Go to the emergency department if you have chest pain, shortness of breath, or other concerning symptoms.   

## 2019-08-06 NOTE — ED Triage Notes (Signed)
All information today by Arnell Sieving [phone].  Patient in today c/o chest, arm and back pain off & on x 6 months. Patient states it doesn't happen very often, but happened today. Patient has not tried any OTC medications.

## 2019-08-06 NOTE — ED Provider Notes (Signed)
Renaldo Fiddler    CSN: [id number] Arrival date & time: 08/06/19  1407      History   Chief Complaint Chief Complaint  Patient presents with  . Chest Pain  . Arm Pain  . Back Pain    HPI Colleen Warner is a 44 y.o. female.   Patient presents with chest pain in her left upper chest, upper arm, and back intermittently x 6 months.  She states she had an episode of this pain today.  She denies nausea, shortness of breath, dizziness, palpitations, or other symptoms.  She states the pain is 4/10, acute, sharp.  No treatments attempted at home.  No falls or injury.  She denies cardiac history.    The history is provided by the patient. A language interpreter was used.    Past Medical History:  Diagnosis Date  . Allergic rhinitis 06/09/2014  . Anemia   . Bartholin cyst 10/04/2012  . Bloating 12/23/2012  . Carpal tunnel syndrome, bilateral 06/09/2014  . Kidney stones   . LLQ abdominal pain 03/09/2015  . Mental disorder    depression  . No pertinent past medical history   . Right flank pain 04/21/2013  . Tinea corporis 01/21/2016  . UTI (urinary tract infection)     Patient Active Problem List   Diagnosis Date Noted  . Hematuria, microscopic 09/17/2016  . Irregular menstrual cycle 01/21/2016  . OCP (oral contraceptive pills) initiation 01/21/2016  . Tinea capitis 01/21/2016  . LLQ abdominal pain 03/09/2015  . Decreased visual acuity 06/09/2014  . Allergic rhinitis 06/09/2014  . Carpal tunnel syndrome, bilateral 06/09/2014  . Right flank pain 04/21/2013  . Bloating 12/23/2012  . Bartholin cyst 10/04/2012  . Depression 12/16/2010    Past Surgical History:  Procedure Laterality Date  . KIDNEY STONE SURGERY      OB History    Gravida  3   Para  3   Term  3   Preterm      AB      Living  3     SAB      TAB      Ectopic      Multiple      Live Births  3            Home Medications    Prior to Admission medications   Medication Sig  Start Date End Date Taking? Authorizing Provider  norethindrone-ethinyl estradiol (NORTREL 0.5/35, 28,) 0.5-35 MG-MCG tablet Take 1 tablet by mouth daily. 01/15/19  Yes Claiborne Rigg, NP  Vitamin D, Ergocalciferol, (DRISDOL) 1.25 MG (50000 UT) CAPS capsule Take 1 capsule (50,000 Units total) by mouth every 7 (seven) days. Please fill as a 90 day supply 03/11/19  Yes Claiborne Rigg, NP  clotrimazole (LOTRIMIN) 1 % cream Apply 1 application topically 2 (two) times daily. 03/11/19   Claiborne Rigg, NP  fluticasone (FLONASE) 50 MCG/ACT nasal spray PLACE 2 SPRAYS INTO BOTH NOSTRILS DAILY. 03/20/19   Hoy Register, MD  ketoconazole (NIZORAL) 2 % shampoo Apply 1 application topically 2 (two) times a week. 03/17/19   Claiborne Rigg, NP  tamsulosin (FLOMAX) 0.4 MG CAPS capsule Take 1 capsule (0.4 mg total) by mouth daily after supper. 08/16/18   Eustace Moore, MD  traMADol (ULTRAM) 50 MG tablet Take 1 tablet (50 mg total) by mouth every 6 (six) hours as needed. 08/16/18   Eustace Moore, MD    Family History Family History  Problem Relation Age of  Onset  . Cancer Father   . Alzheimer's disease Mother     Social History Social History   Tobacco Use  . Smoking status: Never Smoker  . Smokeless tobacco: Never Used  Substance Use Topics  . Alcohol use: Yes    Comment: occasional - ETOH with pregnancy  . Drug use: No     Allergies   Patient has no known allergies.   Review of Systems Review of Systems  Constitutional: Negative for chills and fever.  HENT: Negative for ear pain and sore throat.   Eyes: Negative for pain and visual disturbance.  Respiratory: Negative for cough and shortness of breath.   Cardiovascular: Positive for chest pain. Negative for palpitations.  Gastrointestinal: Negative for abdominal pain, nausea and vomiting.  Genitourinary: Negative for dysuria and hematuria.  Musculoskeletal: Positive for arthralgias and back pain.  Skin: Negative for color  change and rash.  Neurological: Negative for seizures and syncope.  All other systems reviewed and are negative.    Physical Exam Triage Vital Signs ED Triage Vitals  Enc Vitals Group     BP      Pulse      Resp      Temp      Temp src      SpO2      Weight      Height      Head Circumference      Peak Flow      Pain Score      Pain Loc      Pain Edu?      Excl. in [place]?    No data found.  Updated Vital Signs BP 124/81 (BP Location: Left Arm)   Pulse 90   Temp 99.3 F (37.4 C) (Oral)   Resp 18   Ht 4\' 8"  (1.422 m)   Wt 144 lb (65.3 kg)   LMP 07/23/2019 (Approximate)   SpO2 97%   BMI 32.28 kg/m   Visual Acuity Right Eye Distance:   Left Eye Distance:   Bilateral Distance:    Right Eye Near:   Left Eye Near:    Bilateral Near:     Physical Exam Vitals and nursing note reviewed.  Constitutional:      General: She is not in acute distress.    Appearance: She is well-developed. She is not ill-appearing.  HENT:     Head: Normocephalic and atraumatic.     Mouth/Throat:     Mouth: Mucous membranes are moist.     Pharynx: Oropharynx is clear.  Eyes:     Conjunctiva/sclera: Conjunctivae normal.  Cardiovascular:     Rate and Rhythm: Normal rate and regular rhythm.     Heart sounds: Normal heart sounds. No murmur.  Pulmonary:     Effort: Pulmonary effort is normal. No respiratory distress.     Breath sounds: Normal breath sounds. No wheezing or rhonchi.  Abdominal:     General: Bowel sounds are normal.     Palpations: Abdomen is soft.     Tenderness: There is no abdominal tenderness. There is no guarding or rebound.  Musculoskeletal:     Cervical back: Neck supple.     Right lower leg: No edema.     Left lower leg: No edema.  Skin:    General: Skin is warm and dry.     Findings: No rash.  Neurological:     General: No focal deficit present.     Mental Status: She is alert and oriented  to person, place, and time.     Sensory: No sensory deficit.      Motor: No weakness.     Gait: Gait normal.  Psychiatric:        Mood and Affect: Mood normal.        Behavior: Behavior normal.      UC Treatments / Results  Labs (all labs ordered are listed, but only abnormal results are displayed) Labs Reviewed - No data to display  EKG   Radiology No results found.  Procedures Procedures (including critical care time)  Medications Ordered in UC Medications - No data to display  Initial Impression / Assessment and Plan / UC Course  I have reviewed the triage vital signs and the nursing notes.  Pertinent labs & imaging results that were available during my care of the patient were reviewed by me and considered in my medical decision making (see chart for details).   Chest pain.  EKG shows sinus rhythm, rate 72, no ST elevation, compared to previous from 2015.  Discussed with patient that her EKG is normal but that there are limitations to being seen here in an urgent care as opposed to the emergency department.  Instructed patient to go to the emergency department if she has chest pain, shortness of breath, or other concerning symptoms.  Patient agrees to plan of care.      Final Clinical Impressions(s) / UC Diagnoses   Final diagnoses:  Chest pain, unspecified type     Discharge Instructions     Go to the emergency department if you have chest pain, shortness of breath, or other concerning symptoms.      ED Prescriptions    None     PDMP not reviewed this encounter.   Mickie Bail, NP 08/06/19 1453

## 2019-08-15 MED FILL — NORTREL 0.5-35 TABLET: 0.5-35 | 28 days supply | Qty: 28 | Fill #7

## 2019-08-15 MED FILL — VIT D2 1.25 MG (50,000 UNIT: 1.25 MG | 84 days supply | Qty: 12 | Fill #1

## 2019-11-07 ENCOUNTER — Other Ambulatory Visit: Payer: Self-pay | Admitting: Nurse Practitioner

## 2019-11-07 DIAGNOSIS — Z3041 Encounter for surveillance of contraceptive pills: Secondary | ICD-10-CM

## 2019-11-07 MED ORDER — NORTREL 0.5/35 (28) 0.5-35 MG-MCG PO TABS: 1.0000 | ORAL_TABLET | Freq: Every day | ORAL | 3 refills | Status: DC

## 2019-11-07 MED FILL — NORTREL 0.5-35 TABLET: 0.5-35 | 28 days supply | Qty: 28 | Fill #10

## 2019-11-07 NOTE — Telephone Encounter (Signed)
Medication Refill - Medication: norethindrone-ethinyl estradiol (NORTREL 0.5/35, 28,) 0.5-35 MG-MCG tablet    Has the patient contacted their pharmacy? Yes.   (Agent: If no, request that the patient contact the pharmacy for the refill.) (Agent: If yes, when and what did the pharmacy advise?)  Preferred Pharmacy (with phone number or street name):  [facility] - [place], [place] - [address]  [address] [place] [address]  Phone: [phone] Fax: [phone]     Agent: Please be advised that RX refills may take up to 3 business days. We ask that you follow-up with your pharmacy.

## 2019-11-27 ENCOUNTER — Telehealth: Payer: Self-pay

## 2019-11-27 NOTE — Telephone Encounter (Signed)
Pt request refill birth control.  Pt uses [facility] pharmacy. Pt ph [phone].

## 2019-11-28 NOTE — Telephone Encounter (Signed)
Rxs are available in our pharmacy.

## 2019-12-02 ENCOUNTER — Telehealth: Payer: Self-pay

## 2019-12-02 NOTE — Telephone Encounter (Signed)
Request will have to be approved by patient's PCP as it is not current on her rx list.

## 2019-12-02 NOTE — Telephone Encounter (Signed)
1) Medication(s) Requested (by name): terbinafine hcl 250.   2) Pharmacy of Choice: [facility]   3) Special Requests: Call pt advise if we can call in itch pill same as previously prescribed.   Approved medications will be sent to the pharmacy, we will reach out if there is an issue.  Requests made after 3pm may not be addressed until the following business day!  If a patient is unsure of the name of the medication(s) please note and ask patient to call back when they are able to provide all info, do not send to responsible party until all information is available!

## 2019-12-03 ENCOUNTER — Ambulatory Visit
Admission: EM | Admit: 2019-12-03 | Discharge: 2019-12-03 | Disposition: A | Discharge status: Home / Self Care | Payer: Self-pay | Attending: Emergency Medicine | Admitting: Emergency Medicine

## 2019-12-03 DIAGNOSIS — R21 Rash and other nonspecific skin eruption: Secondary | ICD-10-CM

## 2019-12-03 MED ORDER — PREDNISONE 10 MG (21) PO TBPK: ORAL_TABLET | Freq: Every day | ORAL | 0 refills | Status: DC

## 2019-12-03 MED FILL — predniSONE 10 MG TABS: 10 | 7 days supply | Qty: 21 | Fill #0

## 2019-12-03 NOTE — Discharge Instructions (Signed)
Take the prednisone and Benadryl as directed.  Do not drive, operate machinery, or drink alcohol with Benadryl as it may cause drowsiness.    Follow up with your primary care provider if your symptoms are not improving.

## 2019-12-03 NOTE — ED Provider Notes (Signed)
Renaldo Fiddler    CSN: [id number] Arrival date & time: 12/03/19  1059      History   Chief Complaint Chief Complaint  Patient presents with  . Rash    HPI Colleen Warner is a 44 y.o. female.   Patient presents with a pruritic rash on her neck and arm intermittently x several weeks.  The rash comes and goes without known cause.  No treatments attempted at home.  Patient states she changed detergents 1 month ago but the rash had already been present prior to that.  She denies other new products, foods, medications.  She denies fever, chills, chest pain, shortness of breath, abdominal pain, or other symptoms.    The history is provided by the patient. A language interpreter was used.    Past Medical History:  Diagnosis Date  . Allergic rhinitis 06/09/2014  . Anemia   . Bartholin cyst 10/04/2012  . Bloating 12/23/2012  . Carpal tunnel syndrome, bilateral 06/09/2014  . Kidney stones   . LLQ abdominal pain 03/09/2015  . Mental disorder    depression  . No pertinent past medical history   . Right flank pain 04/21/2013  . Tinea corporis 01/21/2016  . UTI (urinary tract infection)     Patient Active Problem List   Diagnosis Date Noted  . Hematuria, microscopic 09/17/2016  . Irregular menstrual cycle 01/21/2016  . OCP (oral contraceptive pills) initiation 01/21/2016  . Tinea capitis 01/21/2016  . LLQ abdominal pain 03/09/2015  . Decreased visual acuity 06/09/2014  . Allergic rhinitis 06/09/2014  . Carpal tunnel syndrome, bilateral 06/09/2014  . Right flank pain 04/21/2013  . Bloating 12/23/2012  . Bartholin cyst 10/04/2012  . Depression 12/16/2010    Past Surgical History:  Procedure Laterality Date  . KIDNEY STONE SURGERY      OB History    Gravida  3   Para  3   Term  3   Preterm      AB      Living  3     SAB      TAB      Ectopic      Multiple      Live Births  3            Home Medications    Prior to Admission medications     Medication Sig Start Date End Date Taking? Authorizing Provider  clotrimazole (LOTRIMIN) 1 % cream Apply 1 application topically 2 (two) times daily. 03/11/19   Claiborne Rigg, NP  fluticasone (FLONASE) 50 MCG/ACT nasal spray PLACE 2 SPRAYS INTO BOTH NOSTRILS DAILY. 03/20/19   Hoy Register, MD  ketoconazole (NIZORAL) 2 % shampoo Apply 1 application topically 2 (two) times a week. 03/17/19   Claiborne Rigg, NP  norethindrone-ethinyl estradiol (NORTREL 0.5/35, 28,) 0.5-35 MG-MCG tablet Take 1 tablet by mouth daily. 11/07/19   Claiborne Rigg, NP  predniSONE (STERAPRED UNI-PAK 21 TAB) 10 MG (21) TBPK tablet Take by mouth daily. As directed. 12/03/19   Mickie Bail, NP  tamsulosin (FLOMAX) 0.4 MG CAPS capsule Take 1 capsule (0.4 mg total) by mouth daily after supper. 08/16/18   Eustace Moore, MD  traMADol (ULTRAM) 50 MG tablet Take 1 tablet (50 mg total) by mouth every 6 (six) hours as needed. 08/16/18   Eustace Moore, MD  Vitamin D, Ergocalciferol, (DRISDOL) 1.25 MG (50000 UT) CAPS capsule Take 1 capsule (50,000 Units total) by mouth every 7 (seven) days. Please fill as a  90 day supply 03/11/19   Claiborne Rigg, NP    Family History Family History  Problem Relation Age of Onset  . Cancer Father   . Alzheimer's disease Mother     Social History Social History   Tobacco Use  . Smoking status: Never Smoker  . Smokeless tobacco: Never Used  Vaping Use  . Vaping Use: Never used  Substance Use Topics  . Alcohol use: Yes    Comment: occasional - ETOH with pregnancy  . Drug use: No     Allergies   Patient has no known allergies.   Review of Systems Review of Systems  Constitutional: Negative for chills and fever.  HENT: Negative for ear pain and sore throat.   Eyes: Negative for pain and visual disturbance.  Respiratory: Negative for cough and shortness of breath.   Cardiovascular: Negative for chest pain and palpitations.  Gastrointestinal: Negative for abdominal  pain and vomiting.  Genitourinary: Negative for dysuria and hematuria.  Musculoskeletal: Negative for arthralgias and back pain.  Skin: Positive for rash. Negative for color change.  Neurological: Negative for seizures and syncope.  All other systems reviewed and are negative.    Physical Exam Triage Vital Signs ED Triage Vitals [12/03/19 1059]  Enc Vitals Group     BP      Pulse      Resp      Temp      Temp src      SpO2      Weight      Height      Head Circumference      Peak Flow      Pain Score 0     Pain Loc      Pain Edu?      Excl. in [place]?    No data found.  Updated Vital Signs BP 113/71 (BP Location: Left Arm)   Pulse 84   Temp 98.3 F (36.8 C) (Oral)   Resp 16   LMP 12/02/2019 (Exact Date)   SpO2 98%   Visual Acuity Right Eye Distance:   Left Eye Distance:   Bilateral Distance:    Right Eye Near:   Left Eye Near:    Bilateral Near:     Physical Exam Vitals and nursing note reviewed.  Constitutional:      General: She is not in acute distress.    Appearance: She is well-developed. She is not ill-appearing.  HENT:     Head: Normocephalic and atraumatic.     Mouth/Throat:     Mouth: Mucous membranes are moist.  Eyes:     Conjunctiva/sclera: Conjunctivae normal.  Cardiovascular:     Rate and Rhythm: Normal rate and regular rhythm.     Heart sounds: No murmur heard.   Pulmonary:     Effort: Pulmonary effort is normal. No respiratory distress.     Breath sounds: Normal breath sounds.  Abdominal:     Palpations: Abdomen is soft.     Tenderness: There is no abdominal tenderness.  Musculoskeletal:     Cervical back: Neck supple.  Skin:    General: Skin is warm and dry.     Findings: Rash present.     Comments: Red confluent patchy papular rash on neck and right antecubital space.  No open lesions or drainage.   Neurological:     General: No focal deficit present.     Mental Status: She is alert and oriented to person, place, and time.  Gait: Gait normal.  Psychiatric:        Mood and Affect: Mood normal.        Behavior: Behavior normal.      UC Treatments / Results  Labs (all labs ordered are listed, but only abnormal results are displayed) Labs Reviewed - No data to display  EKG   Radiology No results found.  Procedures Procedures (including critical care time)  Medications Ordered in UC Medications - No data to display  Initial Impression / Assessment and Plan / UC Course  I have reviewed the triage vital signs and the nursing notes.  Pertinent labs & imaging results that were available during my care of the patient were reviewed by me and considered in my medical decision making (see chart for details).   Rash.  Treating with prednisone and Benadryl.  Precautions for drowsiness with Benadryl discussed with patient.  Instructed her to follow-up with her PCP if her symptoms are not improving.  Patient agrees to plan of care.   Final Clinical Impressions(s) / UC Diagnoses   Final diagnoses:  Rash     Discharge Instructions     Take the prednisone and Benadryl as directed.  Do not drive, operate machinery, or drink alcohol with Benadryl as it may cause drowsiness.    Follow up with your primary care provider if your symptoms are not improving.       ED Prescriptions    Medication Sig Dispense Auth. Provider   predniSONE (STERAPRED UNI-PAK 21 TAB) 10 MG (21) TBPK tablet Take by mouth daily. As directed. 21 tablet Mickie Bail, NP     PDMP not reviewed this encounter.   Mickie Bail, NP 12/03/19 [phone]

## 2019-12-03 NOTE — ED Triage Notes (Signed)
Patient complains of intermittent rash on her right elbow, axilla, hairline, and neck. Patient also reports hair loss where rash appears on hairline. Patient reports she recently changed laundry detergent about one month ago, but states that the rash was present before switching detergent.

## 2019-12-04 MED FILL — NORTREL 0.5-35 TABLET: 0.5-35 | 28 days supply | Qty: 28 | Fill #0

## 2019-12-04 NOTE — Telephone Encounter (Signed)
I have not prescribed her terbinafine. Can we find out what she needs this for?

## 2019-12-04 NOTE — Telephone Encounter (Signed)
Spoke to patient w/ [place] interpreter assist w/ the call. Pt. Stated she went to urgent Care yesterday and they stated they recommend her PCP to prescribe her the Terbinafine for her neck and skin itchiness.

## 2019-12-05 NOTE — Telephone Encounter (Signed)
She was prescribed a tapering steroid pack that she should still be taking and benadryl. Will need to re evaluate after that to see if rash has cleared.

## 2020-01-02 MED FILL — NORTREL 0.5-35 TABLET: 0.5-35 | 28 days supply | Qty: 28 | Fill #1

## 2020-01-14 ENCOUNTER — Encounter: Payer: Self-pay | Admitting: Nurse Practitioner

## 2020-01-14 ENCOUNTER — Ambulatory Visit: Payer: Self-pay | Attending: Nurse Practitioner | Admitting: Nurse Practitioner

## 2020-01-14 DIAGNOSIS — B354 Tinea corporis: Secondary | ICD-10-CM

## 2020-01-14 NOTE — Progress Notes (Signed)
Virtual Visit via Telephone Note Due to national recommendations of social distancing due to COVID 19, telehealth visit is felt to be most appropriate for this patient at this time.  I discussed the limitations, risks, security and privacy concerns of performing an evaluation and management service by telephone and the availability of in person appointments. I also discussed with the patient that there may be a patient responsible charge related to this service. The patient expressed understanding and agreed to proceed.    I connected with Colleen Warner on 01/14/20  at   3:30 PM EDT  EDT by telephone and verified that I am speaking with the correct person using two identifiers.   Consent I discussed the limitations, risks, security and privacy concerns of performing an evaluation and management service by telephone and the availability of in person appointments. I also discussed with the patient that there may be a patient responsible charge related to this service. The patient expressed understanding and agreed to proceed.   Location of Patient: Private  Residence    Location of Provider: [facility]    Persons participating in Telemedicine visit: Colleen Denver FNP-BC [facility] CMA Colleen Warner  Jonetta Speak Spanish Interpreter ID# [id number]   History of Present Illness: Telemedicine visit for: Tinea Corporis Patient has complaints of persistent tinea corporis. Lesions are located on the right [facility] space and over the scalp and around the hair line. Symptoms include patches of scaly skin. Symptoms have been ongoing for about 1.5 years. Previous evaluation and treatment has included prescription topical antifungal per med orders: minimally effective. States there are still current areas of baldness or patchiness of hair growth oral antifungal per med orders: effective with mild improvement.  Patient denies alcohol overuse, fever, hematemesis and  melena. Patient has been advised to apply for financial assistance and schedule to see our financial counselor.      Past Medical History:  Diagnosis Date  . Allergic rhinitis 06/09/2014  . Anemia   . Bartholin cyst 10/04/2012  . Bloating 12/23/2012  . Carpal tunnel syndrome, bilateral 06/09/2014  . Kidney stones   . LLQ abdominal pain 03/09/2015  . Mental disorder    depression  . No pertinent past medical history   . Right flank pain 04/21/2013  . Tinea corporis 01/21/2016  . UTI (urinary tract infection)     Past Surgical History:  Procedure Laterality Date  . KIDNEY STONE SURGERY      Family History  Problem Relation Age of Onset  . Cancer Father   . Alzheimer's disease Mother     Social History   Socioeconomic History  . Marital status: Married    Spouse name: Not on file  . Number of children: Not on file  . Years of education: Not on file  . Highest education level: Not on file  Occupational History  . Not on file  Tobacco Use  . Smoking status: Never Smoker  . Smokeless tobacco: Never Used  Vaping Use  . Vaping Use: Never used  Substance and Sexual Activity  . Alcohol use: Yes    Comment: occasional - ETOH with pregnancy  . Drug use: No  . Sexual activity: Yes    Birth control/protection: Pill  Other Topics Concern  . Not on file  Social History Narrative  . Not on file   Social Determinants of Health   Financial Resource Strain:   . Difficulty of Paying Living Expenses: Not on file  Food Insecurity:   .  Worried About Programme researcher, broadcasting/film/video in the Last Year: Not on file  . Ran Out of Food in the Last Year: Not on file  Transportation Needs:   . Lack of Transportation (Medical): Not on file  . Lack of Transportation (Non-Medical): Not on file  Physical Activity:   . Days of Exercise per Week: Not on file  . Minutes of Exercise per Session: Not on file  Stress:   . Feeling of Stress : Not on file  Social Connections:   . Frequency of Communication  with Friends and Family: Not on file  . Frequency of Social Gatherings with Friends and Family: Not on file  . Attends Religious Services: Not on file  . Active Member of Clubs or Organizations: Not on file  . Attends Banker Meetings: Not on file  . Marital Status: Not on file     Observations/Objective: Awake, alert and oriented x 3   Review of Systems  Constitutional: Negative for fever, malaise/fatigue and weight loss.  HENT: Negative.  Negative for nosebleeds.   Eyes: Negative.  Negative for blurred vision, double vision and photophobia.  Respiratory: Negative.  Negative for cough and shortness of breath.   Cardiovascular: Negative.  Negative for chest pain, palpitations and leg swelling.  Gastrointestinal: Negative.  Negative for heartburn, nausea and vomiting.  Musculoskeletal: Negative.  Negative for myalgias.  Skin: Positive for itching and rash.  Neurological: Negative.  Negative for dizziness, focal weakness, seizures and headaches.  Psychiatric/Behavioral: Negative.  Negative for suicidal ideas.    Assessment and Plan: [name] was seen today for alopecia.  Diagnoses and all orders for this visit:  Tinea corporis -     clotrimazole (LOTRIMIN) 1 % cream; Apply 1 application topically 2 (two) times daily. -     ketoconazole (NIZORAL) 2 % shampoo; Apply 1 application topically 2 (two) times a week.     Follow Up Instructions Return for NEEDS ORANGE CARD/CAFA APP.     I discussed the assessment and treatment plan with the patient. The patient was provided an opportunity to ask questions and all were answered. The patient agreed with the plan and demonstrated an understanding of the instructions.   The patient was advised to call back or seek an in-person evaluation if the symptoms worsen or if the condition fails to improve as anticipated.  I provided 13 minutes of non-face-to-face time during this encounter including median intraservice time, reviewing  previous notes, labs, imaging, medications and explaining diagnosis and management.  Claiborne Rigg, FNP-BC

## 2020-01-17 ENCOUNTER — Encounter: Payer: Self-pay | Admitting: Nurse Practitioner

## 2020-01-17 MED ORDER — KETOCONAZOLE 2 % EX SHAM: 1.0000 "application " | MEDICATED_SHAMPOO | CUTANEOUS | 1 refills | Status: DC

## 2020-01-17 MED ORDER — CLOTRIMAZOLE 1 % EX CREA: 1.0000 "application " | TOPICAL_CREAM | Freq: Two times a day (BID) | CUTANEOUS | 1 refills | Status: DC

## 2020-02-23 MED FILL — NORTREL 0.5-35 TABLET: 0.5-35 | 28 days supply | Qty: 28 | Fill #2

## 2020-03-12 ENCOUNTER — Encounter: Payer: Self-pay | Admitting: Nurse Practitioner

## 2020-03-12 ENCOUNTER — Other Ambulatory Visit: Payer: Self-pay | Admitting: Nurse Practitioner

## 2020-03-12 ENCOUNTER — Ambulatory Visit: Payer: Self-pay | Attending: Nurse Practitioner | Admitting: Nurse Practitioner

## 2020-03-12 ENCOUNTER — Other Ambulatory Visit: Payer: Self-pay

## 2020-03-12 VITALS — BP 109/70 | HR 73 | Ht <= 58 in | Wt 145.6 lb

## 2020-03-12 DIAGNOSIS — Z130 Encounter for screening for diseases of the blood and blood-forming organs and certain disorders involving the immune mechanism: Secondary | ICD-10-CM

## 2020-03-12 DIAGNOSIS — Z114 Encounter for screening for human immunodeficiency virus [HIV]: Secondary | ICD-10-CM

## 2020-03-12 DIAGNOSIS — Z13220 Encounter for screening for lipoid disorders: Secondary | ICD-10-CM

## 2020-03-12 DIAGNOSIS — L209 Atopic dermatitis, unspecified: Secondary | ICD-10-CM

## 2020-03-12 DIAGNOSIS — Z13228 Encounter for screening for other metabolic disorders: Secondary | ICD-10-CM

## 2020-03-12 DIAGNOSIS — Z131 Encounter for screening for diabetes mellitus: Secondary | ICD-10-CM

## 2020-03-12 DIAGNOSIS — Z124 Encounter for screening for malignant neoplasm of cervix: Secondary | ICD-10-CM

## 2020-03-12 DIAGNOSIS — E559 Vitamin D deficiency, unspecified: Secondary | ICD-10-CM

## 2020-03-12 DIAGNOSIS — B350 Tinea barbae and tinea capitis: Secondary | ICD-10-CM

## 2020-03-12 DIAGNOSIS — R3129 Other microscopic hematuria: Secondary | ICD-10-CM

## 2020-03-12 DIAGNOSIS — Z1231 Encounter for screening mammogram for malignant neoplasm of breast: Secondary | ICD-10-CM

## 2020-03-12 MED ORDER — TRIAMCINOLONE ACETONIDE 0.1 % EX CREA: 1.0000 "application " | TOPICAL_CREAM | Freq: Two times a day (BID) | CUTANEOUS | 1 refills | Status: DC

## 2020-03-12 MED FILL — TRIAMCINOLONE 0.1% CREAM: 0.1 | 7 days supply | Qty: 60 | Fill #0

## 2020-03-12 NOTE — Progress Notes (Signed)
Assessment & Plan:  Colleen Warner was seen today for gynecologic exam.  Diagnoses and all orders for this visit:  Encounter for Papanicolaou smear for cervical cancer screening -     Cytology - PAP -     Cancel: Cervicovaginal ancillary only  Breast cancer screening by mammogram -     MM 3D SCREEN BREAST BILATERAL; Future  Encounter for screening for HIV -     HIV antibody (with reflex)  Hematuria, microscopic  Vitamin D deficiency disease -     VITAMIN D 25 Hydroxy (Vit-D Deficiency, Fractures)  Lipid screening -     Lipid panel  Screening for deficiency anemia -     CBC  Screening for metabolic disorder -     CBU38+GTXM  Encounter for screening for diabetes mellitus -     Hemoglobin A1c  Tinea capitis -     Ambulatory referral to Dermatology  Atopic dermatitis, unspecified type -     triamcinolone cream (KENALOG) 0.1 %; Apply 1 application topically 2 (two) times daily.    Patient has been counseled on age-appropriate routine health concerns for screening and prevention. These are reviewed and up-to-date. Referrals have been placed accordingly. Immunizations are up-to-date or declined.    Subjective:   Chief Complaint  Patient presents with   Gynecologic Exam   HPI Colleen Warner 44 y.o. female presents to office today for PAP smear.  Referred to dermatology for seborrheic dermatitis unresponsive to nizoral shampoo and Lotrimin cream. She is also experiencing thinning of the hair around the hairline.   She also has a picture on her phone of her erythematous rash located on the neck and upper chest area.  The rash is not present today however she reports it appears intermittently and goes away after a few days.    Review of Systems  Constitutional: Negative.  Negative for chills, fever, malaise/fatigue and weight loss.       See HPI  Respiratory: Negative.  Negative for cough, shortness of breath and wheezing.   Cardiovascular: Negative.  Negative  for chest pain, orthopnea and leg swelling.  Gastrointestinal: Negative for abdominal pain.  Genitourinary: Negative.  Negative for flank pain.  Skin: Positive for rash.  Psychiatric/Behavioral: Negative for suicidal ideas.    Past Medical History:  Diagnosis Date   Allergic rhinitis 06/09/2014   Anemia    Bartholin cyst 10/04/2012   Bloating 12/23/2012   Carpal tunnel syndrome, bilateral 06/09/2014   Kidney stones    LLQ abdominal pain 03/09/2015   Mental disorder    depression   No pertinent past medical history    Right flank pain 04/21/2013   Tinea corporis 01/21/2016   UTI (urinary tract infection)     Past Surgical History:  Procedure Laterality Date   KIDNEY STONE SURGERY      Family History  Problem Relation Age of Onset   Cancer Father    Alzheimer's disease Mother     Social History Reviewed with no changes to be made today.   Outpatient Medications Prior to Visit  Medication Sig Dispense Refill   clotrimazole (LOTRIMIN) 1 % cream Apply 1 application topically 2 (two) times daily. 60 g 1   ketoconazole (NIZORAL) 2 % shampoo Apply 1 application topically 2 (two) times a week. 120 mL 1   norethindrone-ethinyl estradiol (NORTREL 0.5/35, 28,) 0.5-35 MG-MCG tablet Take 1 tablet by mouth daily. 28 tablet 3   Vitamin D, Ergocalciferol, (DRISDOL) 1.25 MG (50000 UT) CAPS capsule Take 1 capsule (  50,000 Units total) by mouth every 7 (seven) days. Please fill as a 90 day supply (Patient not taking: Reported on 03/12/2020) 12 capsule 1   No facility-administered medications prior to visit.    No Known Allergies     Objective:    BP 109/70    Pulse 73    Ht 4' 10" (1.473 m)    Wt 145 lb 9.6 oz (66 kg)    SpO2 99%    BMI 30.43 kg/m  Wt Readings from Last 3 Encounters:  03/12/20 145 lb 9.6 oz (66 kg)  08/06/19 144 lb (65.3 kg)  03/11/19 142 lb 3.2 oz (64.5 kg)    Physical Exam Vitals and nursing note reviewed. Exam conducted with a chaperone present.    Constitutional:      Appearance: She is well-developed.  HENT:     Head: Normocephalic and atraumatic.  Cardiovascular:     Rate and Rhythm: Normal rate and regular rhythm.     Heart sounds: Normal heart sounds. No murmur heard.  No friction rub. No gallop.   Pulmonary:     Effort: Pulmonary effort is normal. No tachypnea or respiratory distress.     Breath sounds: Normal breath sounds. No decreased breath sounds, wheezing, rhonchi or rales.  Chest:     Chest wall: No tenderness.  Abdominal:     General: Bowel sounds are normal.     Palpations: Abdomen is soft.  Genitourinary:    Exam position: Lithotomy position.     Pubic Area: No rash.      Labia:        Right: No rash, tenderness, lesion or injury.        Left: No rash, tenderness, lesion or injury.   Musculoskeletal:        General: Normal range of motion.     Cervical back: Normal range of motion.  Skin:    General: Skin is warm and dry.  Neurological:     Mental Status: She is alert and oriented to person, place, and time.     Coordination: Coordination normal.  Psychiatric:        Behavior: Behavior normal. Behavior is cooperative.        Thought Content: Thought content normal.        Judgment: Judgment normal.          Patient has been counseled extensively about nutrition and exercise as well as the importance of adherence with medications and regular follow-up. The patient was given clear instructions to go to ER or return to medical center if symptoms don't improve, worsen or new problems develop. The patient verbalized understanding.   Follow-up: Return in about 6 months (around 09/09/2020).   [name], FNP-BC [facility] [place], [place]   03/12/2020, 12:02 PM

## 2020-03-13 LAB — CMP14+EGFR
ALT: 17 IU/L (ref 0–32)
AST: 23 IU/L (ref 0–40)
Albumin/Globulin Ratio: 1.7 (ref 1.2–2.2)
Albumin: 4.5 g/dL (ref 3.8–4.8)
Alkaline Phosphatase: 81 IU/L (ref 44–121)
BUN/Creatinine Ratio: 15 (ref 9–23)
BUN: 10 mg/dL (ref 6–24)
Bilirubin Total: 0.5 mg/dL (ref 0.0–1.2)
CO2: 23 mmol/L (ref 20–29)
Calcium: 9.6 mg/dL (ref 8.7–10.2)
Chloride: 101 mmol/L (ref 96–106)
Creatinine, Ser: 0.66 mg/dL (ref 0.57–1.00)
GFR calc Af Amer: 123 mL/min/{1.73_m2} (ref >59)
GFR calc non Af Amer: 106 mL/min/{1.73_m2} (ref >59)
Globulin, Total: 2.7 g/dL (ref 1.5–4.5)
Glucose: 75 mg/dL (ref 65–99)
Potassium: 4.3 mmol/L (ref 3.5–5.2)
Sodium: 140 mmol/L (ref 134–144)
Total Protein: 7.2 g/dL (ref 6.0–8.5)

## 2020-03-13 LAB — VITAMIN D 25 HYDROXY (VIT D DEFICIENCY, FRACTURES): Vit D, 25-Hydroxy: 40 ng/mL (ref 30.0–100.0)

## 2020-03-13 LAB — CBC
Hematocrit: 38 % (ref 34.0–46.6)
Hemoglobin: 12.5 g/dL (ref 11.1–15.9)
MCH: 28.3 pg (ref 26.6–33.0)
MCHC: 32.9 g/dL (ref 31.5–35.7)
MCV: 86 fL (ref 79–97)
Platelets: 277 10*3/uL (ref 150–450)
RBC: 4.42 x10E6/uL (ref 3.77–5.28)
RDW: 12.7 % (ref 11.7–15.4)
WBC: 6.2 10*3/uL (ref 3.4–10.8)

## 2020-03-13 LAB — LIPID PANEL
Chol/HDL Ratio: 4.3 ratio (ref 0.0–4.4)
Cholesterol, Total: 229 mg/dL — ABNORMAL HIGH (ref 100–199)
HDL: 53 mg/dL (ref >39)
LDL Chol Calc (NIH): 127 mg/dL — ABNORMAL HIGH (ref 0–99)
Triglycerides: 278 mg/dL — ABNORMAL HIGH (ref 0–149)
VLDL Cholesterol Cal: 49 mg/dL — ABNORMAL HIGH (ref 5–40)

## 2020-03-13 LAB — HEMOGLOBIN A1C
Est. average glucose Bld gHb Est-mCnc: 117 mg/dL
Hgb A1c MFr Bld: 5.7 % — ABNORMAL HIGH (ref 4.8–5.6)

## 2020-03-13 LAB — HIV ANTIBODY (ROUTINE TESTING W REFLEX): HIV Screen 4th Generation wRfx: NONREACTIVE

## 2020-03-15 ENCOUNTER — Other Ambulatory Visit: Payer: Self-pay | Admitting: Obstetrics and Gynecology

## 2020-03-15 DIAGNOSIS — Z1231 Encounter for screening mammogram for malignant neoplasm of breast: Secondary | ICD-10-CM

## 2020-03-16 LAB — CYTOLOGY - PAP
Comment: NEGATIVE
Diagnosis: NEGATIVE
High risk HPV: NEGATIVE

## 2020-03-22 ENCOUNTER — Ambulatory Visit: Payer: Self-pay | Attending: Nurse Practitioner

## 2020-03-22 ENCOUNTER — Other Ambulatory Visit: Payer: Self-pay

## 2020-04-09 ENCOUNTER — Other Ambulatory Visit: Payer: Self-pay | Admitting: Nurse Practitioner

## 2020-04-09 DIAGNOSIS — Z3041 Encounter for surveillance of contraceptive pills: Secondary | ICD-10-CM

## 2020-04-09 MED ORDER — NORTREL 0.5/35 (28) 0.5-35 MG-MCG PO TABS: 1.0000 | ORAL_TABLET | Freq: Every day | ORAL | 3 refills | Status: DC

## 2020-04-09 MED FILL — NORTREL 0.5-35 TABLET: 0.5-35 | 28 days supply | Qty: 28 | Fill #0

## 2020-04-09 NOTE — Telephone Encounter (Signed)
Medication: norethindrone-ethinyl estradiol (NORTREL 0.5/35, 28,) 0.5-35 MG-MCG tablet [[id number]]   Has the patient contacted their pharmacy? YES  (Agent: If no, request that the patient contact the pharmacy for the refill.) (Agent: If yes, when and what did the pharmacy advise?)  Preferred Pharmacy (with phone number or street name): [facility] [place], [place] - [address]  [address], [place] [address]  Phone:  [phone] Fax:  [phone]   Agent: Please be advised that RX refills may take up to 3 business days. We ask that you follow-up with your pharmacy.

## 2020-05-14 MED FILL — NORTREL 0.5-35 TABLET: 0.5-35 | 28 days supply | Qty: 28 | Fill #1

## 2020-05-18 ENCOUNTER — Ambulatory Visit: Payer: Self-pay

## 2020-05-20 ENCOUNTER — Ambulatory Visit
Admission: RE | Admit: 2020-05-20 | Discharge: 2020-05-20 | Disposition: A | Discharge status: Home / Self Care | Payer: No Typology Code available for payment source | Source: Ambulatory Visit | Attending: Obstetrics and Gynecology | Admitting: Obstetrics and Gynecology

## 2020-05-20 ENCOUNTER — Encounter (INDEPENDENT_AMBULATORY_CARE_PROVIDER_SITE_OTHER): Payer: Self-pay

## 2020-05-20 ENCOUNTER — Other Ambulatory Visit: Payer: Self-pay

## 2020-05-20 ENCOUNTER — Ambulatory Visit: Payer: No Typology Code available for payment source | Admitting: *Deleted

## 2020-05-20 VITALS — BP 116/84 | Wt 148.7 lb

## 2020-05-20 DIAGNOSIS — Z1231 Encounter for screening mammogram for malignant neoplasm of breast: Secondary | ICD-10-CM

## 2020-05-20 DIAGNOSIS — Z1239 Encounter for other screening for malignant neoplasm of breast: Secondary | ICD-10-CM

## 2020-05-20 NOTE — Progress Notes (Signed)
[name] is a 45 y.o. female who presents to [facility] today with no complaints.    Pap Smear: Pap smear not completed today. Last Pap smear was 03/12/2020 at [facility] and was normal with negative HPV. Per patient has no history of an abnormal Pap smear. Last Pap smear result is available in [facility].   Physical exam: Breasts Breasts symmetrical. No skin abnormalities bilateral breasts. No nipple retraction bilateral breasts. No nipple discharge bilateral breasts. No lymphadenopathy. No lumps palpated bilateral breasts. No complaints of pain or tenderness on exam.       Pelvic/Bimanual Pap is not indicated today per Colleen Warner.   Smoking History: Patient has never smoked.   Patient Navigation: Patient education provided. Access to services provided for patient through [facility]leen [facility]. Spanish interpreter Colleen Warner from [facility]  provided.   Colleen and Cervical Cancer Risk Assessment: Patient does not have family history of Colleen cancer, known genetic mutations, or radiation treatment to the chest before age 86. Patient does not have history of cervical dysplasia, immunocompromised, or DES exposure in-utero.  Risk Assessment    Risk Scores      05/20/2020   Last edited by: Colleen Warner, CMA   5-year risk: 0.5 %   Lifetime risk: 6 %         A: Colleen exam without pap smear No complaints.  P: Referred patient to the [facility] for a screening mammogram on the mobile unit. Appointment scheduled Thursday, May 20, 2020 at 1410.  Colleen Heidelberg, RN 05/20/2020 1:20 PM

## 2020-05-20 NOTE — Patient Instructions (Addendum)
Explained breast self awareness with [employer]. Patient did not need a Pap smear today due to last Pap smear and HPV typing was 03/12/2020. Let her know [facility] will cover Pap smears and HPV typing every 5 years unless has a history of abnormal Pap smears. Referred patient to the [facility] for a screening mammogram on the mobile unit. Appointment scheduled Thursday, May 20, 2020 at 1410. Patient escorted to the mobile unit following [facility] appointment for her screening mammogram. Let patient know that the Breast Center will follow-up with her within the next couple of weeks with results of her mammogram by letter or phone. Colleen Warner verbalized understanding.  [name], [name], RN 1:20 PM

## 2020-06-10 MED FILL — NORTREL 0.5-35 TABLET: 0.5-35 | 28 days supply | Qty: 28 | Fill #2

## 2020-07-08 MED FILL — NORTREL 0.5-35 TABLET: 0.5-35 | 28 days supply | Qty: 28 | Fill #3

## 2020-07-16 ENCOUNTER — Encounter (HOSPITAL_COMMUNITY): Payer: Self-pay | Admitting: Emergency Medicine

## 2020-07-16 ENCOUNTER — Emergency Department (HOSPITAL_COMMUNITY): Payer: No Typology Code available for payment source

## 2020-07-16 ENCOUNTER — Other Ambulatory Visit: Payer: Self-pay

## 2020-07-16 ENCOUNTER — Emergency Department (HOSPITAL_COMMUNITY)
Admission: EM | Admit: 2020-07-16 | Discharge: 2020-07-16 | Disposition: A | Discharge status: Home / Self Care | Payer: No Typology Code available for payment source | Attending: Emergency Medicine | Admitting: Emergency Medicine

## 2020-07-16 DIAGNOSIS — Z87442 Personal history of urinary calculi: Secondary | ICD-10-CM | POA: Insufficient documentation

## 2020-07-16 DIAGNOSIS — N201 Calculus of ureter: Secondary | ICD-10-CM | POA: Insufficient documentation

## 2020-07-16 DIAGNOSIS — N390 Urinary tract infection, site not specified: Secondary | ICD-10-CM

## 2020-07-16 LAB — COMPREHENSIVE METABOLIC PANEL
ALT: 21 U/L (ref 0–44)
AST: 25 U/L (ref 15–41)
Albumin: 3.9 g/dL (ref 3.5–5.0)
Alkaline Phosphatase: 58 U/L (ref 38–126)
Anion gap: 11 (ref 5–15)
BUN: 9 mg/dL (ref 6–20)
CO2: 24 mmol/L (ref 22–32)
Calcium: 9.1 mg/dL (ref 8.9–10.3)
Chloride: 102 mmol/L (ref 98–111)
Creatinine, Ser: 0.81 mg/dL (ref 0.44–1.00)
GFR, Estimated: >60 mL/min (ref >60)
Glucose, Bld: 118 mg/dL — ABNORMAL HIGH (ref 70–99)
Potassium: 3.9 mmol/L (ref 3.5–5.1)
Sodium: 137 mmol/L (ref 135–145)
Total Bilirubin: 0.9 mg/dL (ref 0.3–1.2)
Total Protein: 7.1 g/dL (ref 6.5–8.1)

## 2020-07-16 LAB — URINALYSIS, ROUTINE W REFLEX MICROSCOPIC
Bilirubin Urine: NEGATIVE
Glucose, UA: NEGATIVE mg/dL
Ketones, ur: 5 mg/dL — AB
Nitrite: NEGATIVE
Protein, ur: NEGATIVE mg/dL
Specific Gravity, Urine: 1.01 (ref 1.005–1.030)
pH: 5 (ref 5.0–8.0)

## 2020-07-16 LAB — CBC WITH DIFFERENTIAL/PLATELET
Abs Immature Granulocytes: 0.03 10*3/uL (ref 0.00–0.07)
Basophils Absolute: 0 10*3/uL (ref 0.0–0.1)
Basophils Relative: 0 %
Eosinophils Absolute: 0 10*3/uL (ref 0.0–0.5)
Eosinophils Relative: 0 %
HCT: 37.6 % (ref 36.0–46.0)
Hemoglobin: 12.5 g/dL (ref 12.0–15.0)
Immature Granulocytes: 0 %
Lymphocytes Relative: 15 %
Lymphs Abs: 1.6 10*3/uL (ref 0.7–4.0)
MCH: 29.3 pg (ref 26.0–34.0)
MCHC: 33.2 g/dL (ref 30.0–36.0)
MCV: 88.3 fL (ref 80.0–100.0)
Monocytes Absolute: 0.5 10*3/uL (ref 0.1–1.0)
Monocytes Relative: 4 %
Neutro Abs: 8.5 10*3/uL — ABNORMAL HIGH (ref 1.7–7.7)
Neutrophils Relative %: 81 %
Platelets: 272 10*3/uL (ref 150–400)
RBC: 4.26 MIL/uL (ref 3.87–5.11)
RDW: 13.2 % (ref 11.5–15.5)
WBC: 10.7 10*3/uL — ABNORMAL HIGH (ref 4.0–10.5)
nRBC: 0 % (ref 0.0–0.2)

## 2020-07-16 LAB — LIPASE, BLOOD: Lipase: 27 U/L (ref 11–51)

## 2020-07-16 LAB — I-STAT BETA HCG BLOOD, ED (MC, WL, AP ONLY): I-stat hCG, quantitative: <5 m[IU]/mL (ref <5)

## 2020-07-16 MED ORDER — ONDANSETRON HCL 4 MG/2ML IJ SOLN
4.0000 mg | Freq: Once | INTRAMUSCULAR | Status: AC
  Administered 2020-07-16: 4 mg via INTRAVENOUS
  Filled 2020-07-16: qty 2

## 2020-07-16 MED ORDER — OXYCODONE-ACETAMINOPHEN 5-325 MG PO TABS
1.0000 | ORAL_TABLET | Freq: Once | ORAL | Status: AC
Start: 2020-07-16 — End: 2020-07-16
  Administered 2020-07-16: 1 via ORAL
  Filled 2020-07-16: qty 1

## 2020-07-16 MED ORDER — KETOROLAC TROMETHAMINE 30 MG/ML IJ SOLN
30.0000 mg | Freq: Once | INTRAMUSCULAR | Status: AC
  Administered 2020-07-16: 30 mg via INTRAVENOUS
  Filled 2020-07-16: qty 1

## 2020-07-16 MED ORDER — SODIUM CHLORIDE 0.9 % IV SOLN
1000.0000 mL | INTRAVENOUS | Status: DC
  Administered 2020-07-16: 1000 mL via INTRAVENOUS

## 2020-07-16 MED ORDER — NAPROXEN 500 MG PO TABS: 500.0000 mg | ORAL_TABLET | Freq: Two times a day (BID) | ORAL | 0 refills | Status: DC

## 2020-07-16 MED ORDER — HYDROCODONE-ACETAMINOPHEN 5-325 MG PO TABS: 1.0000 | ORAL_TABLET | Freq: Four times a day (QID) | ORAL | 0 refills | Status: DC | PRN

## 2020-07-16 MED ORDER — MORPHINE SULFATE (PF) 4 MG/ML IV SOLN
4.0000 mg | Freq: Once | INTRAVENOUS | Status: AC
  Administered 2020-07-16: 4 mg via INTRAVENOUS
  Filled 2020-07-16: qty 1

## 2020-07-16 MED ORDER — SODIUM CHLORIDE 0.9 % IV BOLUS (SEPSIS)
1000.0000 mL | Freq: Once | INTRAVENOUS | Status: AC
  Administered 2020-07-16: 1000 mL via INTRAVENOUS

## 2020-07-16 MED ORDER — CEPHALEXIN 500 MG PO CAPS: 500.0000 mg | ORAL_CAPSULE | Freq: Three times a day (TID) | ORAL | 0 refills | Status: DC

## 2020-07-16 MED ORDER — SODIUM CHLORIDE 0.9 % IV SOLN
1.0000 g | Freq: Once | INTRAVENOUS | Status: AC
  Administered 2020-07-16: 1 g via INTRAVENOUS
  Filled 2020-07-16: qty 10

## 2020-07-16 MED ORDER — ONDANSETRON 8 MG PO TBDP: 8.0000 mg | ORAL_TABLET | Freq: Three times a day (TID) | ORAL | 0 refills | Status: DC | PRN

## 2020-07-16 NOTE — ED Notes (Signed)
Pt taken to CT.

## 2020-07-16 NOTE — ED Provider Notes (Signed)
  Physical Exam  BP (!) 103/56 (BP Location: Right Arm)   Pulse 80   Temp 98.5 F (36.9 C)   Resp 18   SpO2 100%   Physical Exam  ED Course/Procedures   Clinical Course as of 07/16/20 1715  Fri Jul 16, 2020  1349 CT scan confirms a left ureteral stone [JK]    Clinical Course User Index [JK] Linwood Dibbles, MD    Procedures  MDM  Care assumed at 3 PM.  Patient has left flank pain and has a 2 to 3 mm obstructing stone.  Signout pending urinalysis  5:15 PM UA showed blood and some bacteria and some white blood cell count.  Patient's WBC is 10 and patient is afebrile.  She is definitely not septic.  She may have an inflammation versus early UTI.  I ordered Rocephin and she will be discharged home with Keflex.  Vicodin and Zofran was already ordered by previous provider.  Case management came to help with medication assistance so she can afford her medicines.  I told her that she definitely need to call urology on Monday for follow-up at next week.  Urine culture sent.  Gave strict return precautions     Charlynne Pander, MD 07/16/20 [phone]

## 2020-07-16 NOTE — Discharge Instructions (Addendum)
Take motrin for pain and norco for severe pain   You have a urine infection. Take keflex three times daily for a week   You need to call urologist next week for follow up   Return to ER if you have worse flank pain, vomiting, abdominal pain

## 2020-07-16 NOTE — Care Management (Signed)
  [place] Medication Assistance Card Name:  [name] ID (MRN): [id number] [place]: [id number] RX Group: [id number] Discharge Date: 07/16/2020 Expiration Date: 07/26/2020                                           (must be filled within 7 days of discharge)   Dear   :  Colleen Warner  You have been approved to have the prescriptions written by your discharging physician filled through our [facility] (Medication Assistance Through [facility]) program. This program allows for a one-time (no refills) 34-day supply of selected medications for a low copay amount.  The copay is $3.00 per prescription. For instance, if you have one prescription, you will pay $3.00; for two prescriptions, you pay $6.00; for three prescriptions, you pay $9.00; and so on.  Only certain pharmacies are participating in this program with [facility]. You will need to select one of the pharmacies from the attached list and take your prescriptions, this letter, and your photo ID to one of the participating pharmacies.   We are excited that you are able to use the [facility] program to get your medications. These prescriptions must be filled within 7 days of hospital discharge or they will no longer be valid for the [facility] program. Should you have any problems with your prescriptions please contact your case management team member at [phone] for [name]  [name], [name]   ED RNCM met with patient at bedside, patient is followed at the [facility] but they are closed until Monday patient states she cannot afford prescriptions. Patient is uninsured, discussed [place] program patient is agreeable to services, Patient enrolled and Letter given with instructions on how to redeem prescriptions. EDP has sent prescriptions to [facility] on [address]. No further ED TOC needs identified.  Wendi Maya RN BSN NCM  [phone]

## 2020-07-16 NOTE — ED Triage Notes (Signed)
Pt here with c/o left side flank pain , pt has hx of kidney stone

## 2020-07-16 NOTE — ED Provider Notes (Signed)
[facility] EMERGENCY DEPARTMENT Provider Note   CSN: [id number] Arrival date & time: 07/16/20  0856     History Cc flank pain  Colleen Warner is a 45 y.o. female.  The history is provided by the patient.  Flank Pain This is a new problem. Episode onset: today. The problem occurs constantly. Associated symptoms include abdominal pain. Pertinent negatives include no chest pain. Nothing relieves the symptoms. She has tried nothing for the symptoms.  Pt has history of kidney stones.  This feels the same.  The pain is sharp and severe on the left side.  It goes into the abdomen.  No fevers or chills.     Past Medical History:  Diagnosis Date  . Allergic rhinitis 06/09/2014  . Anemia   . Bartholin cyst 10/04/2012  . Bloating 12/23/2012  . Carpal tunnel syndrome, bilateral 06/09/2014  . Kidney stones   . LLQ abdominal pain 03/09/2015  . Mental disorder    depression  . No pertinent past medical history   . Right flank pain 04/21/2013  . Tinea corporis 01/21/2016  . UTI (urinary tract infection)     Patient Active Problem List   Diagnosis Date Noted  . Hematuria, microscopic 09/17/2016  . Irregular menstrual cycle 01/21/2016  . OCP (oral contraceptive pills) initiation 01/21/2016  . Tinea capitis 01/21/2016  . LLQ abdominal pain 03/09/2015  . Decreased visual acuity 06/09/2014  . Allergic rhinitis 06/09/2014  . Carpal tunnel syndrome, bilateral 06/09/2014  . Right flank pain 04/21/2013  . Bloating 12/23/2012  . Bartholin cyst 10/04/2012  . Depression 12/16/2010    Past Surgical History:  Procedure Laterality Date  . KIDNEY STONE SURGERY       OB History    Gravida  3   Para  3   Term  3   Preterm      AB      Living  3     SAB      IAB      Ectopic      Multiple      Live Births  3           Family History  Problem Relation Age of Onset  . Cancer Father   . Alzheimer's disease Mother     Social History   Tobacco  Use  . Smoking status: Never Smoker  . Smokeless tobacco: Never Used  Vaping Use  . Vaping Use: Never used  Substance Use Topics  . Alcohol use: Not Currently    Comment: occasional - ETOH with pregnancy  . Drug use: No    Home Medications Prior to Admission medications   Medication Sig Start Date End Date Taking? Authorizing Provider  HYDROcodone-acetaminophen (NORCO/VICODIN) 5-325 MG tablet Take 1 tablet by mouth every 6 (six) hours as needed. 07/16/20  Yes Linwood Dibbles, MD  naproxen (NAPROSYN) 500 MG tablet Take 1 tablet (500 mg total) by mouth 2 (two) times daily with a meal. As needed for pain 07/16/20  Yes Linwood Dibbles, MD  ondansetron (ZOFRAN ODT) 8 MG disintegrating tablet Take 1 tablet (8 mg total) by mouth every 8 (eight) hours as needed for nausea or vomiting. 07/16/20  Yes Linwood Dibbles, MD  clotrimazole (LOTRIMIN) 1 % cream Apply 1 application topically 2 (two) times daily. Patient not taking: Reported on 05/20/2020 01/17/20   Claiborne Rigg, NP  ketoconazole (NIZORAL) 2 % shampoo Apply 1 application topically 2 (two) times a week. Patient not taking: Reported on 05/20/2020 01/19/20  Claiborne Rigg, NP  norethindrone-ethinyl estradiol (NORTREL 0.5/35, 28,) 0.5-35 MG-MCG tablet Take 1 tablet by mouth daily. 04/09/20   Claiborne Rigg, NP  triamcinolone cream (KENALOG) 0.1 % Apply 1 application topically 2 (two) times daily. Patient not taking: Reported on 05/20/2020 03/12/20   Claiborne Rigg, NP  Vitamin D, Ergocalciferol, (DRISDOL) 1.25 MG (50000 UT) CAPS capsule Take 1 capsule (50,000 Units total) by mouth every 7 (seven) days. Please fill as a 90 day supply Patient not taking: Reported on 05/20/2020 03/11/19   Claiborne Rigg, NP    Allergies    Patient has no known allergies.  Review of Systems   Review of Systems  Cardiovascular: Negative for chest pain.  Gastrointestinal: Positive for abdominal pain.  Genitourinary: Positive for flank pain.  All other systems reviewed  and are negative.   Physical Exam Updated Vital Signs BP 118/68 (BP Location: Right Arm)   Pulse 88   Temp 98.5 F (36.9 C)   Resp 18   SpO2 99%   Physical Exam Vitals and nursing note reviewed.  Constitutional:      Appearance: She is well-developed. She is ill-appearing.  HENT:     Head: Normocephalic and atraumatic.     Right Ear: External ear normal.     Left Ear: External ear normal.  Eyes:     General: No scleral icterus.       Right eye: No discharge.        Left eye: No discharge.     Conjunctiva/sclera: Conjunctivae normal.  Neck:     Trachea: No tracheal deviation.  Cardiovascular:     Rate and Rhythm: Normal rate and regular rhythm.  Pulmonary:     Effort: Pulmonary effort is normal. No respiratory distress.     Breath sounds: Normal breath sounds. No stridor. No wheezing or rales.  Abdominal:     General: Bowel sounds are normal. There is no distension.     Palpations: Abdomen is soft.     Tenderness: There is abdominal tenderness. There is left CVA tenderness. There is no guarding or rebound.  Musculoskeletal:        General: No tenderness.     Cervical back: Neck supple.  Skin:    General: Skin is warm and dry.     Findings: No rash.  Neurological:     Mental Status: She is alert.     Cranial Nerves: No cranial nerve deficit (no facial droop, extraocular movements intact, no slurred speech).     Sensory: No sensory deficit.     Motor: No abnormal muscle tone or seizure activity.     Coordination: Coordination normal.     ED Results / Procedures / Treatments   Labs (all labs ordered are listed, but only abnormal results are displayed) Labs Reviewed  COMPREHENSIVE METABOLIC PANEL - Abnormal; Notable for the following components:      Result Value   Glucose, Bld 118 (*)    All other components within normal limits  CBC WITH DIFFERENTIAL/PLATELET - Abnormal; Notable for the following components:   WBC 10.7 (*)    Neutro Abs 8.5 (*)    All other  components within normal limits  LIPASE, BLOOD  URINALYSIS, ROUTINE W REFLEX MICROSCOPIC  I-STAT BETA HCG BLOOD, ED ([facility], [facility], [facility] ONLY)    EKG None  Radiology CT Renal Stone Study  Result Date: 07/16/2020 CLINICAL DATA:  Flank pain, left-sided, history of kidney stones EXAM: CT ABDOMEN AND PELVIS WITHOUT CONTRAST TECHNIQUE:  Multidetector CT imaging of the abdomen and pelvis was performed following the standard protocol without IV contrast. COMPARISON:  04/03/2013 FINDINGS: Lower chest: No acute abnormality. Hepatobiliary: No solid liver abnormality is seen. Multiple gallstones in the gallbladder. Gallbladder wall thickening, or biliary dilatation. Pancreas: Unremarkable. No pancreatic ductal dilatation or surrounding inflammatory changes. Spleen: Normal in size without significant abnormality. Adrenals/Urinary Tract: Adrenal glands are unremarkable. There is a 2-3 mm calculus at the left ureterovesicular junction with mild associated left hydronephrosis and hydroureter (series 7, image 67). There are multiple additional tiny bilateral renal calculi. Bladder is unremarkable. Stomach/Bowel: Stomach is within normal limits. Appendix appears normal. No evidence of bowel wall thickening, distention, or inflammatory changes. Vascular/Lymphatic: No significant vascular findings are present. No enlarged abdominal or pelvic lymph nodes. Reproductive: No mass or other significant abnormality. Other: No abdominal wall hernia or abnormality. No abdominopelvic ascites. Musculoskeletal: No acute or significant osseous findings. IMPRESSION: 1. There is a 2-3 mm calculus at the left ureterovesicular junction with mild associated left hydronephrosis and hydroureter. 2. Multiple additional tiny bilateral renal calculi. 3. Cholelithiasis. Electronically Signed   By: Lauralyn Primes M.D.   On: 07/16/2020 13:30    Procedures Procedures   Medications Ordered in ED Medications  sodium chloride 0.9 % bolus 1,000 mL (0 mLs  Intravenous Stopped 07/16/20 1326)    Followed by  0.9 %  sodium chloride infusion (1,000 mLs Intravenous New Bag/Given 07/16/20 1327)  oxyCODONE-acetaminophen (PERCOCET/ROXICET) 5-325 MG per tablet 1 tablet (1 tablet Oral Given 07/16/20 0900)  morphine 4 MG/ML injection 4 mg (4 mg Intravenous Given 07/16/20 1144)  ondansetron (ZOFRAN) injection 4 mg (4 mg Intravenous Given 07/16/20 1143)  ketorolac (TORADOL) 30 MG/ML injection 30 mg (30 mg Intravenous Given 07/16/20 1414)    ED Course  I have reviewed the triage vital signs and the nursing notes.  Pertinent labs & imaging results that were available during my care of the patient were reviewed by me and considered in my medical decision making (see chart for details).  Clinical Course as of 07/16/20 1534  Fri Jul 16, 2020  1349 CT scan confirms a left ureteral stone [JK]    Clinical Course User Index [JK] Linwood Dibbles, MD   MDM Rules/Calculators/A&P                          Patient presented to the ED for evaluation of flank pain.  ED work-up consistent with a kidney stone.  CT scan confirms left-sided ureteral stone.  Patient's laboratory tests otherwise are unremarkable urinalysis currently pending.  Will evaluate to make sure there is no signs of infection.  Patient was improved with pain medications.  Toradol ultimately was most effective.  Plan on discharge home with prescriptions for pain medications and antinausea medications. Final Clinical Impression(s) / ED Diagnoses Final diagnoses:  Ureteral stone    Rx / DC Orders ED Discharge Orders         Ordered    ondansetron (ZOFRAN ODT) 8 MG disintegrating tablet  Every 8 hours PRN        07/16/20 1534    HYDROcodone-acetaminophen (NORCO/VICODIN) 5-325 MG tablet  Every 6 hours PRN        07/16/20 1534    naproxen (NAPROSYN) 500 MG tablet  2 times daily with meals        07/16/20 1534           Linwood Dibbles, MD 07/16/20 1535

## 2020-07-18 LAB — URINE CULTURE: Culture: <10000 — AB

## 2020-07-20 ENCOUNTER — Ambulatory Visit (INDEPENDENT_AMBULATORY_CARE_PROVIDER_SITE_OTHER): Payer: Self-pay | Admitting: Urology

## 2020-07-20 ENCOUNTER — Encounter: Payer: Self-pay | Admitting: Urology

## 2020-07-20 ENCOUNTER — Other Ambulatory Visit: Payer: Self-pay

## 2020-07-20 ENCOUNTER — Other Ambulatory Visit: Payer: Self-pay | Admitting: Urology

## 2020-07-20 VITALS — BP 118/77 | HR 83 | Ht <= 58 in | Wt 146.0 lb

## 2020-07-20 DIAGNOSIS — R3129 Other microscopic hematuria: Secondary | ICD-10-CM

## 2020-07-20 DIAGNOSIS — N200 Calculus of kidney: Secondary | ICD-10-CM

## 2020-07-20 DIAGNOSIS — N201 Calculus of ureter: Secondary | ICD-10-CM

## 2020-07-20 LAB — URINALYSIS, COMPLETE
Bilirubin, UA: NEGATIVE
Glucose, UA: NEGATIVE
Ketones, UA: NEGATIVE
Leukocytes,UA: NEGATIVE
Nitrite, UA: NEGATIVE
Protein,UA: NEGATIVE
Specific Gravity, UA: 1.025 (ref 1.005–1.030)
Urobilinogen, Ur: 0.2 mg/dL (ref 0.2–1.0)
pH, UA: 6 (ref 5.0–7.5)

## 2020-07-20 LAB — MICROSCOPIC EXAMINATION

## 2020-07-20 MED ORDER — TAMSULOSIN HCL 0.4 MG PO CAPS: 0.4000 mg | ORAL_CAPSULE | Freq: Every day | ORAL | 0 refills | Status: DC

## 2020-07-20 MED FILL — TAMSULOSIN HCL 0.4 MG CAP: 0.4 | 30 days supply | Qty: 30 | Fill #0

## 2020-07-20 NOTE — Progress Notes (Signed)
07/20/2020 12:04 PM   Colleen Warner [birth year] [id number]  Referring provider: Claiborne Rigg, NP [address] [place],  [place] [address]  Chief Complaint  Patient presents with  . Nephrolithiasis    HPI: 45 year old female who presented for further evaluation of a 2 mm left distal ureteral calculus.  She was seen and evaluated in the emergency room on 07/16/2020 with acute onset of severe left flank pain.  She was found to have a 2 mm left UVJ stone with proximal hydroureteronephrosis.  She had no leukocytosis but her urine did show both blood as well is a few WBCs and many bacteria along with multiple squamous epithelial cells.  At of utmost precaution, she was prescribed Keflex.  Urine culture was ultimately negative.  Today, she continues to have some mild which she describes as "warmth" in her left lower quadrant along with some dull pain.  Urinalysis today shows 3-10 red blood cells per high-powered field otherwise unremarkable.  She is accompanied today by a Engineer, structural.  She does have a personal history of kidney stones.  She passed 1 spontaneously greater than 10 years ago and needed to have what sounds like ureteroscopy 7 years ago for stone.  She does have multiple punctate upper tract stones on this most recent scan.  She asked today about dietary intervention.   PMH: Past Medical History:  Diagnosis Date  . Allergic rhinitis 06/09/2014  . Anemia   . Bartholin cyst 10/04/2012  . Bloating 12/23/2012  . Carpal tunnel syndrome, bilateral 06/09/2014  . Kidney stones   . LLQ abdominal pain 03/09/2015  . Mental disorder    depression  . No pertinent past medical history   . Right flank pain 04/21/2013  . Tinea corporis 01/21/2016  . UTI (urinary tract infection)     Surgical History: Past Surgical History:  Procedure Laterality Date  . KIDNEY STONE SURGERY     7 years ago-[facility] Medications:  Allergies as of 07/20/2020   No  Known Allergies     Medication List       Accurate as of July 20, 2020 12:04 PM. If you have any questions, ask your nurse or doctor.        STOP taking these medications   clotrimazole 1 % cream Commonly known as: LOTRIMIN Stopped by: Vanna Scotland, MD   ketoconazole 2 % shampoo Commonly known as: NIZORAL Stopped by: Vanna Scotland, MD   ondansetron 8 MG disintegrating tablet Commonly known as: Zofran ODT Stopped by: Vanna Scotland, MD   triamcinolone 0.1 % Commonly known as: KENALOG Stopped by: Vanna Scotland, MD   Vitamin D (Ergocalciferol) 1.25 MG (50000 UNIT) Caps capsule Commonly known as: DRISDOL Stopped by: Vanna Scotland, MD     TAKE these medications   cephALEXin 500 MG capsule Commonly known as: KEFLEX Take 1 capsule (500 mg total) by mouth 3 (three) times daily.   HYDROcodone-acetaminophen 5-325 MG tablet Commonly known as: NORCO/VICODIN Take 1 tablet by mouth every 6 (six) hours as needed.   naproxen 500 MG tablet Commonly known as: Naprosyn Take 1 tablet (500 mg total) by mouth 2 (two) times daily with a meal. As needed for pain   Nortrel 0.5/35 (28) 0.5-35 MG-MCG tablet Generic drug: norethindrone-ethinyl estradiol Take 1 tablet by mouth daily.   tamsulosin 0.4 MG Caps capsule Commonly known as: FLOMAX Take 1 capsule (0.4 mg total) by mouth daily. Started by: Vanna Scotland, MD  Allergies: No Known Allergies  Family History: Family History  Problem Relation Age of Onset  . Cancer Father   . Alzheimer's disease Mother     Social History:  reports that she has never smoked. She has never used smokeless tobacco. She reports previous alcohol use. She reports that she does not use drugs.   Physical Exam: BP 118/77   Pulse 83   Ht 4\' 10"  (1.473 m)   Wt 146 lb (66.2 kg)   LMP  (LMP Unknown)   BMI 30.51 kg/m   Constitutional:  Alert and oriented, No acute distress. HEENT:  AT, moist mucus membranes.  Trachea midline, no  masses. Cardiovascular: No clubbing, cyanosis, or edema. Respiratory: Normal respiratory effort, no increased work of breathing.. Skin: No rashes, bruises or suspicious lesions. Neurologic: Grossly intact, no focal deficits, moving all 4 extremities. Psychiatric: Normal mood and affect.  Laboratory Data: Lab Results  Component Value Date   WBC 10.7 (H) 07/16/2020   HGB 12.5 07/16/2020   HCT 37.6 07/16/2020   MCV 88.3 07/16/2020   PLT 272 07/16/2020    Lab Results  Component Value Date   CREATININE 0.81 07/16/2020    Lab Results  Component Value Date   HGBA1C 5.7 (H) 03/12/2020    Urinalysis Microscopic blood otherwise unremarkable  Pertinent Imaging:  CT Renal Stone Study  Narrative CLINICAL DATA:  Flank pain, left-sided, history of kidney stones  EXAM: CT ABDOMEN AND PELVIS WITHOUT CONTRAST  TECHNIQUE: Multidetector CT imaging of the abdomen and pelvis was performed following the standard protocol without IV contrast.  COMPARISON:  04/03/2013  FINDINGS: Lower chest: No acute abnormality.  Hepatobiliary: No solid liver abnormality is seen. Multiple gallstones in the gallbladder. Gallbladder wall thickening, or biliary dilatation.  Pancreas: Unremarkable. No pancreatic ductal dilatation or surrounding inflammatory changes.  Spleen: Normal in size without significant abnormality.  Adrenals/Urinary Tract: Adrenal glands are unremarkable. There is a 2-3 mm calculus at the left ureterovesicular junction with mild associated left hydronephrosis and hydroureter (series 7, image 67). There are multiple additional tiny bilateral renal calculi. Bladder is unremarkable.  Stomach/Bowel: Stomach is within normal limits. Appendix appears normal. No evidence of bowel wall thickening, distention, or inflammatory changes.  Vascular/Lymphatic: No significant vascular findings are present. No enlarged abdominal or pelvic lymph nodes.  Reproductive: No mass or other  significant abnormality.  Other: No abdominal wall hernia or abnormality. No abdominopelvic ascites.  Musculoskeletal: No acute or significant osseous findings.  IMPRESSION: 1. There is a 2-3 mm calculus at the left ureterovesicular junction with mild associated left hydronephrosis and hydroureter. 2. Multiple additional tiny bilateral renal calculi. 3. Cholelithiasis.   Electronically Signed By: 14/07/2012 M.D. On: 07/16/2020 13:30  CT scan images were personally reviewed today.  Agree with radiologic interpretation.  Assessment & Plan:    1. Left ureteral calculus 2 to 3 mm left UVJ stone  Given her persistent mild discomfort as well as ongoing microscopic hematuria, suspect she has not yet passed the stone.  Statistically, she is an excellent chance of passing this spontaneously given the size and location, greater than 90%.  She may continue pain meds as prescribed by the ED and will complete her course of antibiotic.    We will also prescribe Flomax today.  Continue to strain urine.  Warning symptoms reviewed.  If she develops severe symptoms, she should call our office sooner to discuss intervention.  Follow-up in 4 weeks unless she passes the stone in the interim.   -  Urinalysis, Complete - DG Abd 1 View; Future  2. Hematuria, microscopic Likely secondary to #1, will recheck urine once a stone episode has resolved  3. Kidney stones Multiple nonobstructing punctate renal calculi  We discussed general stone prevention techniques including drinking plenty water with goal of producing 2.5 L urine daily, increased citric acid intake, avoidance of high oxalate containing foods, and decreased salt intake.  Information about dietary recommendations given today.   Return in about 4 weeks (around 08/17/2020).  With UA and KUB  Vanna Scotland, MD  [facility] [address] [place], [place] [address] [phone]

## 2020-07-27 ENCOUNTER — Ambulatory Visit: Payer: Self-pay | Admitting: Dermatology

## 2020-08-05 ENCOUNTER — Other Ambulatory Visit: Payer: Self-pay

## 2020-08-05 ENCOUNTER — Other Ambulatory Visit: Payer: Self-pay | Admitting: Nurse Practitioner

## 2020-08-05 DIAGNOSIS — Z3041 Encounter for surveillance of contraceptive pills: Secondary | ICD-10-CM

## 2020-08-05 MED ORDER — NORTREL 0.5/35 (28) 0.5-35 MG-MCG PO TABS
1.0000 | ORAL_TABLET | Freq: Every day | ORAL | 1 refills | Status: DC
  Filled 2020-08-05: qty 28, 28d supply, fill #0
  Filled 2020-09-30: qty 28, 28d supply, fill #1

## 2020-08-05 NOTE — Telephone Encounter (Signed)
  Notes to clinic: review for change in medication as requested by pharmacy    Requested Prescriptions  Pending Prescriptions Disp Refills   norethindrone-ethinyl estradiol (NORTREL 0.5/35, 28,) 0.5-35 MG-MCG tablet 28 tablet 3    Sig: TAKE 1 TABLET BY MOUTH DAILY.      OB/GYN:  Contraceptives Passed - 08/05/2020  1:29 PM      Passed - Last BP in normal range    BP Readings from Last 1 Encounters:  07/20/20 118/77          Passed - Valid encounter within last 12 months    Recent Outpatient Visits           4 months ago Encounter for Papanicolaou smear for cervical cancer screening   [facility] [place], Shea Stakes, NP   6 months ago Tinea corporis   [facility] [place], Shea Stakes, NP   1 year ago Encounter for annual physical exam   [facility] [place], Shea Stakes, NP   2 years ago Eustachian tube disorder, left   [facility] [place], Shea Stakes, NP   2 years ago Well woman exam without gynecological exam   [facility] [place], Shea Stakes, NP       Future Appointments             In 1 week Vanna Scotland, MD [facility]

## 2020-08-06 ENCOUNTER — Other Ambulatory Visit: Payer: Self-pay

## 2020-08-09 ENCOUNTER — Other Ambulatory Visit: Payer: Self-pay

## 2020-08-10 ENCOUNTER — Other Ambulatory Visit: Payer: Self-pay

## 2020-08-17 ENCOUNTER — Ambulatory Visit
Admission: RE | Admit: 2020-08-17 | Discharge: 2020-08-17 | Disposition: A | Discharge status: Home / Self Care | Payer: No Typology Code available for payment source | Attending: Urology | Admitting: Urology

## 2020-08-17 ENCOUNTER — Ambulatory Visit (INDEPENDENT_AMBULATORY_CARE_PROVIDER_SITE_OTHER): Payer: Self-pay | Admitting: Urology

## 2020-08-17 ENCOUNTER — Other Ambulatory Visit: Payer: Self-pay

## 2020-08-17 ENCOUNTER — Ambulatory Visit
Admission: RE | Admit: 2020-08-17 | Discharge: 2020-08-17 | Disposition: A | Discharge status: Home / Self Care | Payer: No Typology Code available for payment source | Source: Ambulatory Visit | Attending: Urology | Admitting: Urology

## 2020-08-17 VITALS — BP 113/78 | HR 65 | Ht <= 58 in | Wt 143.0 lb

## 2020-08-17 DIAGNOSIS — N201 Calculus of ureter: Secondary | ICD-10-CM

## 2020-08-17 DIAGNOSIS — N200 Calculus of kidney: Secondary | ICD-10-CM

## 2020-08-17 DIAGNOSIS — R3129 Other microscopic hematuria: Secondary | ICD-10-CM

## 2020-08-17 NOTE — Progress Notes (Signed)
08/17/2020 12:14 PM   Colleen Warner [birth year] [id number]  Referring provider: Claiborne Rigg, NP [address] [place],  [place] [address]  Chief Complaint  Patient presents with  . Nephrolithiasis    HPI: 45 year old female who returns for 1 month follow-up accompanied by a Engineer, structural.  She was seen in March found to have a 2 mm left UVJ stone with proximal hydroureteronephrosis.  She had persistent microscopic blood in her urine on the day of the visit.  She also has multiple punctate upper tract stones.  She returns today with KUB.  This was personally reviewed today.  There is no evidence of residual distal ureteral stone KUB today.  Overall, she seems to be doing well.  She has occasional midline low back pain as well as some pelvic bloating but no specific renal colic or urinary symptoms.  Ua today is negative, no evidence of ongoing microscopic blood, SEE EPIC  She has been actively working to increase fluid intake.   PMH: Past Medical History:  Diagnosis Date  . Allergic rhinitis 06/09/2014  . Anemia   . Bartholin cyst 10/04/2012  . Bloating 12/23/2012  . Carpal tunnel syndrome, bilateral 06/09/2014  . Kidney stones   . LLQ abdominal pain 03/09/2015  . Mental disorder    depression  . No pertinent past medical history   . Right flank pain 04/21/2013  . Tinea corporis 01/21/2016  . UTI (urinary tract infection)     Surgical History: Past Surgical History:  Procedure Laterality Date  . KIDNEY STONE SURGERY     7 years ago-[facility] Medications:  Allergies as of 08/17/2020   No Known Allergies     Medication List       Accurate as of August 17, 2020 12:14 PM. If you have any questions, ask your nurse or doctor.        STOP taking these medications   cephALEXin 500 MG capsule Commonly known as: KEFLEX Stopped by: Vanna Scotland, MD   HYDROcodone-acetaminophen 5-325 MG tablet Commonly known as: NORCO/VICODIN Stopped  by: Vanna Scotland, MD   naproxen 500 MG tablet Commonly known as: Naprosyn Stopped by: Vanna Scotland, MD     TAKE these medications   Nortrel 0.5/35 (28) 0.5-35 MG-MCG tablet Generic drug: norethindrone-ethinyl estradiol Take 1 tablet by mouth daily. Must have office visit for refills   tamsulosin 0.4 MG Caps capsule Commonly known as: FLOMAX TAKE 1 CAPSULE (0.4 MG TOTAL) BY MOUTH DAILY.       Allergies: No Known Allergies  Family History: Family History  Problem Relation Age of Onset  . Cancer Father   . Alzheimer's disease Mother     Social History:  reports that she has never smoked. She has never used smokeless tobacco. She reports previous alcohol use. She reports that she does not use drugs.   Physical Exam: BP 113/78   Pulse 65   Ht 4\' 10"  (1.473 m)   Wt 143 lb (64.9 kg)   LMP  (LMP Unknown)   BMI 29.89 kg/m   Constitutional:  Alert and oriented, No acute distress. HEENT: [place] AT, moist mucus membranes.  Trachea midline, no masses. Cardiovascular: No clubbing, cyanosis, or edema. Respiratory: Normal respiratory effort, no increased work of breathing.. Skin: No rashes, bruises or suspicious lesions. Neurologic: Grossly intact, no focal deficits, moving all 4 extremities. Psychiatric: Normal mood and affect.  Pertinent Imaging: KUB personally reviewed, no calculi.  Pelvic phleboliths corresponding to the list on  CT.  Assessment & Plan:    1. Left ureteral calculus Suspect interval stone passage given negative KUB today, negative urinalysis and lack of pain  She does have bilateral punctate upper tract stones which are quite small, recommend observation  Patient is reviewed stone diet recommendations, continue to follow this lifestyle  2. Kidney stones As above  3. Hematuria, microscopic Resolved with interval passage of stone - Urinalysis, Complete   Return if symptoms worsen or fail to improve.  Vanna Scotland, MD  [facility] [address], [address] [place], [place] [address] [phone]

## 2020-08-19 LAB — MICROSCOPIC EXAMINATION

## 2020-08-19 LAB — URINALYSIS, COMPLETE
Bilirubin, UA: NEGATIVE
Glucose, UA: NEGATIVE
Ketones, UA: NEGATIVE
Leukocytes,UA: NEGATIVE
Nitrite, UA: NEGATIVE
Protein,UA: NEGATIVE
Specific Gravity, UA: 1.015 (ref 1.005–1.030)
Urobilinogen, Ur: 0.2 mg/dL (ref 0.2–1.0)
pH, UA: 6 (ref 5.0–7.5)

## 2020-09-30 ENCOUNTER — Other Ambulatory Visit: Payer: Self-pay

## 2020-10-28 ENCOUNTER — Other Ambulatory Visit: Payer: Self-pay | Admitting: Family Medicine

## 2020-10-28 ENCOUNTER — Other Ambulatory Visit: Payer: Self-pay

## 2020-10-28 DIAGNOSIS — Z3041 Encounter for surveillance of contraceptive pills: Secondary | ICD-10-CM

## 2020-10-28 NOTE — Telephone Encounter (Signed)
  Notes to clinic:  attempted to contact patient no answer  Due for follow up 5/20222 Review for refill    Requested Prescriptions  Pending Prescriptions Disp Refills   norethindrone-ethinyl estradiol (NORTREL 0.5/35, 28,) 0.5-35 MG-MCG tablet 28 tablet 1    Sig: Take 1 tablet by mouth daily. Must have office visit for refills      OB/GYN:  Contraceptives Passed - 10/28/2020  9:55 AM      Passed - Last BP in normal range    BP Readings from Last 1 Encounters:  08/17/20 113/78          Passed - Valid encounter within last 12 months    Recent Outpatient Visits           7 months ago Encounter for Papanicolaou smear for cervical cancer screening   [facility] [place], Shea Stakes, NP   9 months ago Tinea corporis   [facility] [place], Shea Stakes, NP   1 year ago Encounter for annual physical exam   [facility] [place], Shea Stakes, NP   2 years ago Eustachian tube disorder, left   [facility] [place], Shea Stakes, NP   2 years ago Well woman exam without gynecological exam   [facility] [place], Shea Stakes, NP

## 2020-10-29 ENCOUNTER — Other Ambulatory Visit: Payer: Self-pay

## 2020-10-29 MED ORDER — NORTREL 0.5/35 (28) 0.5-35 MG-MCG PO TABS
1.0000 | ORAL_TABLET | Freq: Every day | ORAL | 0 refills | Status: DC
  Filled 2020-10-29: qty 28, 28d supply, fill #0

## 2020-11-02 ENCOUNTER — Encounter: Payer: Self-pay | Admitting: Nurse Practitioner

## 2020-11-02 ENCOUNTER — Other Ambulatory Visit: Payer: Self-pay

## 2020-11-02 ENCOUNTER — Ambulatory Visit: Payer: Self-pay | Attending: Nurse Practitioner | Admitting: Nurse Practitioner

## 2020-11-02 DIAGNOSIS — R7309 Other abnormal glucose: Secondary | ICD-10-CM

## 2020-11-02 DIAGNOSIS — Z30019 Encounter for initial prescription of contraceptives, unspecified: Secondary | ICD-10-CM

## 2020-11-02 DIAGNOSIS — E785 Hyperlipidemia, unspecified: Secondary | ICD-10-CM

## 2020-11-02 DIAGNOSIS — D72829 Elevated white blood cell count, unspecified: Secondary | ICD-10-CM

## 2020-11-02 MED ORDER — NORTREL 0.5/35 (28) 0.5-35 MG-MCG PO TABS
1.0000 | ORAL_TABLET | Freq: Every day | ORAL | 11 refills | Status: DC
  Filled 2020-11-02: qty 28, 28d supply, fill #0
  Filled 2020-11-24: qty 56, 56d supply, fill #0
  Filled 2021-01-13: qty 56, 56d supply, fill #1
  Filled 2021-03-14: qty 56, 56d supply, fill #2
  Filled 2021-05-12: qty 56, 56d supply, fill #0
  Filled 2021-07-11: qty 56, 56d supply, fill #1
  Filled 2021-09-05: qty 28, 28d supply, fill #2
  Filled 2021-09-30: qty 28, 28d supply, fill #3

## 2020-11-02 NOTE — Progress Notes (Signed)
Virtual Visit via Telephone Note Due to national recommendations of social distancing due to [place] 19, telehealth visit is felt to be most appropriate for this patient at this time.  I discussed the limitations, risks, security and privacy concerns of performing an evaluation and management service by telephone and the availability of in person appointments. I also discussed with the patient that there may be a patient responsible charge related to this service. The patient expressed understanding and agreed to proceed.    I connected with Colleen Warner on 11/02/20  at   9:10 AM EDT  EDT by telephone and verified that I am speaking with the correct person using two identifiers.  Location of Patient: Private Residence   Location of Provider: [place] and [place] participating in Telemedicine visit: Geryl Rankins FNP-BC [place]  Spanish Interpreter [place] [phone]   History of Present Illness: Telemedicine visit for: OCP refill  She is feeling good today. Denies chest pain, shortness of breath, palpitations, lightheadedness, dizziness, headaches or BLE edema.  Would like refill of her birth control pills. Denies any GU symptoms.     Past Medical History:  Diagnosis Date   Allergic rhinitis 06/09/2014   Anemia    Bartholin cyst 10/04/2012   Bloating 12/23/2012   Carpal tunnel syndrome, bilateral 06/09/2014   Kidney stones    LLQ abdominal pain 03/09/2015   Mental disorder    depression   No pertinent past medical history    Right flank pain 04/21/2013   Tinea corporis 01/21/2016   UTI (urinary tract infection)     Past Surgical History:  Procedure Laterality Date   KIDNEY STONE SURGERY     7 years ago-[facility] [place]    Family History  Problem Relation Age of Onset   Cancer Father    Alzheimer's disease Mother     Social History   Socioeconomic History   Marital status: Married    Spouse name: Not on file   Number  of children: Not on file   Years of education: Not on file   Highest education level: Not on file  Occupational History   Not on file  Tobacco Use   Smoking status: Never   Smokeless tobacco: Never  Vaping Use   Vaping Use: Never used  Substance and Sexual Activity   Alcohol use: Not Currently    Comment: occasional - ETOH with pregnancy   Drug use: No   Sexual activity: Yes    Birth control/protection: Pill  Other Topics Concern   Not on file  Social History Narrative   Not on file   Social Determinants of Health   Financial Resource Strain: Not on file  Food Insecurity: Not on file  Transportation Needs: No Transportation Needs   Lack of Transportation (Medical): No   Lack of Transportation (Non-Medical): No  Physical Activity: Not on file  Stress: Not on file  Social Connections: Not on file     Observations/Objective: Awake, alert and oriented x 3   Review of Systems  Constitutional:  Negative for fever, malaise/fatigue and weight loss.  HENT: Negative.  Negative for nosebleeds.   Eyes: Negative.  Negative for blurred vision, double vision and photophobia.  Respiratory: Negative.  Negative for cough and shortness of breath.   Cardiovascular: Negative.  Negative for chest pain, palpitations and leg swelling.  Gastrointestinal: Negative.  Negative for heartburn, nausea and vomiting.  Musculoskeletal: Negative.  Negative for myalgias.  Neurological: Negative.  Negative for dizziness,  focal weakness, seizures and headaches.  Psychiatric/Behavioral: Negative.  Negative for suicidal ideas.    Assessment and Plan: Diagnoses and all orders for this visit:  Encounter for female birth control -     POCT urine pregnancy; Future -     norethindrone-ethinyl estradiol (NORTREL 0.5/35, 28,) 0.5-35 MG-MCG tablet; Take 1 tablet by mouth daily for 28 days.  Elevated glucose -     Hemoglobin A1c; Future -     CMP14+EGFR; Future  Dyslipidemia, goal LDL below 100 -     Lipid  panel; Future  Leukocytosis, unspecified type -     CBC with Differential; Future    Follow Up Instructions Return for Physical ONLY no labs.     I discussed the assessment and treatment plan with the patient. The patient was provided an opportunity to ask questions and all were answered. The patient agreed with the plan and demonstrated an understanding of the instructions.   The patient was advised to call back or seek an in-person evaluation if the symptoms worsen or if the condition fails to improve as anticipated.  I provided 8 minutes of non-face-to-face time during this encounter including median intraservice time, reviewing previous notes, labs, imaging, medications and explaining diagnosis and management.  [name], FNP-BC

## 2020-11-24 ENCOUNTER — Other Ambulatory Visit: Payer: Self-pay

## 2020-11-25 ENCOUNTER — Other Ambulatory Visit: Payer: Self-pay

## 2021-01-13 ENCOUNTER — Other Ambulatory Visit: Payer: Self-pay

## 2021-01-13 ENCOUNTER — Ambulatory Visit: Payer: Self-pay | Attending: Nurse Practitioner

## 2021-01-13 DIAGNOSIS — E785 Hyperlipidemia, unspecified: Secondary | ICD-10-CM

## 2021-01-13 DIAGNOSIS — R7309 Other abnormal glucose: Secondary | ICD-10-CM

## 2021-01-13 DIAGNOSIS — Z30019 Encounter for initial prescription of contraceptives, unspecified: Secondary | ICD-10-CM

## 2021-01-13 DIAGNOSIS — D72829 Elevated white blood cell count, unspecified: Secondary | ICD-10-CM

## 2021-01-13 LAB — POCT URINE PREGNANCY: Preg Test, Ur: NEGATIVE

## 2021-01-14 ENCOUNTER — Other Ambulatory Visit: Payer: Self-pay

## 2021-01-14 LAB — CBC WITH DIFFERENTIAL/PLATELET
Basophils Absolute: 0 10*3/uL (ref 0.0–0.2)
Basos: 0 %
EOS (ABSOLUTE): 0 10*3/uL (ref 0.0–0.4)
Eos: 1 %
Hematocrit: 38.3 % (ref 34.0–46.6)
Hemoglobin: 12.9 g/dL (ref 11.1–15.9)
Immature Grans (Abs): 0 10*3/uL (ref 0.0–0.1)
Immature Granulocytes: 0 %
Lymphocytes Absolute: 2.6 10*3/uL (ref 0.7–3.1)
Lymphs: 35 %
MCH: 29.5 pg (ref 26.6–33.0)
MCHC: 33.7 g/dL (ref 31.5–35.7)
MCV: 87 fL (ref 79–97)
Monocytes Absolute: 0.5 10*3/uL (ref 0.1–0.9)
Monocytes: 7 %
Neutrophils Absolute: 4.2 10*3/uL (ref 1.4–7.0)
Neutrophils: 57 %
Platelets: 288 10*3/uL (ref 150–450)
RBC: 4.38 x10E6/uL (ref 3.77–5.28)
RDW: 13.2 % (ref 11.7–15.4)
WBC: 7.4 10*3/uL (ref 3.4–10.8)

## 2021-01-14 LAB — HEMOGLOBIN A1C
Est. average glucose Bld gHb Est-mCnc: 111 mg/dL
Hgb A1c MFr Bld: 5.5 % (ref 4.8–5.6)

## 2021-01-14 LAB — LIPID PANEL
Chol/HDL Ratio: 3.2 ratio (ref 0.0–4.4)
Cholesterol, Total: 189 mg/dL (ref 100–199)
HDL: 59 mg/dL (ref >39)
LDL Chol Calc (NIH): 94 mg/dL (ref 0–99)
Triglycerides: 213 mg/dL — ABNORMAL HIGH (ref 0–149)
VLDL Cholesterol Cal: 36 mg/dL (ref 5–40)

## 2021-01-14 LAB — CMP14+EGFR
ALT: 15 IU/L (ref 0–32)
AST: 18 IU/L (ref 0–40)
Albumin/Globulin Ratio: 1.8 (ref 1.2–2.2)
Albumin: 4.4 g/dL (ref 3.8–4.8)
Alkaline Phosphatase: 64 IU/L (ref 44–121)
BUN/Creatinine Ratio: 15 (ref 9–23)
BUN: 9 mg/dL (ref 6–24)
Bilirubin Total: 0.7 mg/dL (ref 0.0–1.2)
CO2: 24 mmol/L (ref 20–29)
Calcium: 9.8 mg/dL (ref 8.7–10.2)
Chloride: 99 mmol/L (ref 96–106)
Creatinine, Ser: 0.62 mg/dL (ref 0.57–1.00)
Globulin, Total: 2.5 g/dL (ref 1.5–4.5)
Glucose: 78 mg/dL (ref 65–99)
Potassium: 3.9 mmol/L (ref 3.5–5.2)
Sodium: 138 mmol/L (ref 134–144)
Total Protein: 6.9 g/dL (ref 6.0–8.5)
eGFR: 110 mL/min/{1.73_m2} (ref >59)

## 2021-01-19 ENCOUNTER — Other Ambulatory Visit: Payer: Self-pay

## 2021-01-19 ENCOUNTER — Ambulatory Visit: Payer: Self-pay | Attending: Nurse Practitioner

## 2021-02-04 ENCOUNTER — Telehealth: Payer: Self-pay | Admitting: Nurse Practitioner

## 2021-02-04 NOTE — Telephone Encounter (Signed)
Called Pt VM not set up.

## 2021-02-04 NOTE — Telephone Encounter (Signed)
Pt requesting results from recent labwork. Doesn't have my chart at the moment but worried about results. Needs interepreter.

## 2021-02-08 ENCOUNTER — Telehealth: Payer: Self-pay

## 2021-02-08 NOTE — Telephone Encounter (Signed)
Telephone message

## 2021-02-17 ENCOUNTER — Telehealth: Payer: Self-pay | Admitting: Nurse Practitioner

## 2021-02-17 NOTE — Telephone Encounter (Signed)
I return Pt call, Pt was inform that we still waiting for [facility] to review the application will call back when we have an update

## 2021-02-17 NOTE — Telephone Encounter (Signed)
Copied from CRM [id number]. Topic: General - Other >> Feb 17, 2021 12:12 PM Aretta Nip wrote: Reason for CRM: [phone] Colleen Warner FU  pt states it is been almost a month since processed paperwork financial, please FU with how the process is going, upcoming appt.

## 2021-02-28 ENCOUNTER — Encounter: Payer: No Typology Code available for payment source | Admitting: Nurse Practitioner

## 2021-03-14 ENCOUNTER — Other Ambulatory Visit: Payer: Self-pay

## 2021-03-21 ENCOUNTER — Encounter: Payer: Self-pay | Admitting: Nurse Practitioner

## 2021-03-21 ENCOUNTER — Other Ambulatory Visit: Payer: Self-pay

## 2021-03-21 ENCOUNTER — Ambulatory Visit: Payer: Self-pay | Attending: Nurse Practitioner | Admitting: Nurse Practitioner

## 2021-03-21 VITALS — BP 105/69 | HR 77 | Ht <= 58 in | Wt 143.5 lb

## 2021-03-21 DIAGNOSIS — R109 Unspecified abdominal pain: Secondary | ICD-10-CM

## 2021-03-21 DIAGNOSIS — Z0000 Encounter for general adult medical examination without abnormal findings: Secondary | ICD-10-CM

## 2021-03-21 DIAGNOSIS — Z1211 Encounter for screening for malignant neoplasm of colon: Secondary | ICD-10-CM

## 2021-03-21 DIAGNOSIS — B350 Tinea barbae and tinea capitis: Secondary | ICD-10-CM

## 2021-03-21 DIAGNOSIS — Z1159 Encounter for screening for other viral diseases: Secondary | ICD-10-CM

## 2021-03-21 MED ORDER — KETOCONAZOLE 2 % EX SHAM
1.0000 "application " | MEDICATED_SHAMPOO | CUTANEOUS | 3 refills | Status: DC
  Filled 2021-03-21 – 2021-05-12 (×2): qty 120, 30d supply, fill #0

## 2021-03-21 NOTE — Progress Notes (Signed)
[place] [phone]

## 2021-03-21 NOTE — Progress Notes (Signed)
Assessment & Plan:  [name] was seen today for annual exam.  Diagnoses and all orders for this visit:  Encounter for annual physical exam  Acute left flank pain -     Urinalysis, Complete -     Basic metabolic panel  Tinea capitis -     ketoconazole (NIZORAL) 2 % shampoo; Apply 1 application topically 2 (two) times a week.  Need for hepatitis C screening test -     HCV Ab w Reflex to Quant PCR  Colon cancer screening -     Fecal occult blood, imunochemical([facility]/Sunquest)   Patient has been counseled on age-appropriate routine health concerns for screening and prevention. These are reviewed and up-to-date. Referrals have been placed accordingly. Immunizations are up-to-date or declined.    Subjective:   Chief Complaint  Patient presents with   Annual Exam   HPI Colleen Warner 45 y.o. female presents to office today for annual physical. States she is very stressed as her brother was diagnosed with metastatic thymus cancer  Endorses acute left flank pain. Denies any dysuria, urgency or frequency.   Requesting refill of nizoral shampoo for tinea capitis. Notes significant improvement in symptoms with using.    Review of Systems  Constitutional:  Negative for fever, malaise/fatigue and weight loss.  HENT: Negative.  Negative for nosebleeds.   Eyes: Negative.  Negative for blurred vision, double vision and photophobia.  Respiratory: Negative.  Negative for cough and shortness of breath.   Cardiovascular: Negative.  Negative for chest pain, palpitations and leg swelling.  Gastrointestinal: Negative.  Negative for blood in stool, constipation, diarrhea, heartburn, nausea and vomiting.  Genitourinary:  Positive for flank pain. Negative for dysuria, frequency, hematuria and urgency.  Musculoskeletal:  Negative for myalgias.  Skin: Negative.   Neurological: Negative.  Negative for dizziness, focal weakness, seizures and headaches.  Endo/Heme/Allergies: Negative.    Psychiatric/Behavioral: Negative.  Negative for suicidal ideas.    Past Medical History:  Diagnosis Date   Allergic rhinitis 06/09/2014   Anemia    Bartholin cyst 10/04/2012   Bloating 12/23/2012   Carpal tunnel syndrome, bilateral 06/09/2014   Kidney stones    LLQ abdominal pain 03/09/2015   Mental disorder    depression   No pertinent past medical history    Right flank pain 04/21/2013   Tinea corporis 01/21/2016   UTI (urinary tract infection)     Past Surgical History:  Procedure Laterality Date   KIDNEY STONE SURGERY     7 years ago-[place]    Family History  Problem Relation Age of Onset   Cancer Father    Alzheimer's disease Mother     Social History Reviewed with no changes to be made today.   Outpatient Medications Prior to Visit  Medication Sig Dispense Refill   norethindrone-ethinyl estradiol (NORTREL 0.5/35, 28,) 0.5-35 MG-MCG tablet Take 1 tablet by mouth daily for 28 days. 28 tablet 11   No facility-administered medications prior to visit.    No Known Allergies     Objective:    BP 105/69   Pulse 77   Ht 4\' 10"  (1.473 m)   Wt 143 lb 8 oz (65.1 kg)   LMP 03/14/2021 (Approximate)   SpO2 99%   BMI 29.99 kg/m  Wt Readings from Last 3 Encounters:  03/21/21 143 lb 8 oz (65.1 kg)  08/17/20 143 lb (64.9 kg)  07/20/20 146 lb (66.2 kg)    Physical Exam Constitutional:      Appearance: She is well-developed.  HENT:     Head: Normocephalic and atraumatic.     Right Ear: Hearing, tympanic membrane, ear canal and external ear normal.     Left Ear: Hearing, tympanic membrane, ear canal and external ear normal.     Nose: Nose normal.     Mouth/Throat:     Lips: Pink.     Mouth: Mucous membranes are moist.     Pharynx: No oropharyngeal exudate.  Eyes:     General: No scleral icterus.       Right eye: No discharge.     Extraocular Movements: Extraocular movements intact.     Conjunctiva/sclera: Conjunctivae normal.     Pupils: Pupils are equal,  round, and reactive to light.  Neck:     Thyroid: No thyromegaly.     Trachea: No tracheal deviation.  Cardiovascular:     Rate and Rhythm: Normal rate and regular rhythm.     Heart sounds: Normal heart sounds. No murmur heard.   No friction rub.  Pulmonary:     Effort: Pulmonary effort is normal. No accessory muscle usage or respiratory distress.     Breath sounds: Normal breath sounds. No decreased breath sounds, wheezing, rhonchi or rales.  Abdominal:     General: Bowel sounds are normal. There is no distension.     Palpations: Abdomen is soft. There is no mass.     Tenderness: There is no abdominal tenderness. There is no guarding or rebound.  Musculoskeletal:        General: No tenderness or deformity. Normal range of motion.     Cervical back: Normal range of motion and neck supple.  Lymphadenopathy:     Cervical: No cervical adenopathy.  Skin:    General: Skin is warm and dry.     Findings: No erythema.  Neurological:     Mental Status: She is alert and oriented to person, place, and time.     Cranial Nerves: No cranial nerve deficit.     Coordination: Coordination normal.     Deep Tendon Reflexes: Reflexes are normal and symmetric.  Psychiatric:        Speech: Speech normal.        Behavior: Behavior normal.        Thought Content: Thought content normal.        Judgment: Judgment normal.         Patient has been counseled extensively about nutrition and exercise as well as the importance of adherence with medications and regular follow-up. The patient was given clear instructions to go to ER or return to medical center if symptoms don't improve, worsen or new problems develop. The patient verbalized understanding.   Follow-up: Return in about 3 months (around 06/21/2021) for prediabetes.   [name], FNP-BC [facility] and [place], [place]   03/21/2021, 3:30 PM

## 2021-03-22 LAB — URINALYSIS, COMPLETE
Bilirubin, UA: NEGATIVE
Glucose, UA: NEGATIVE
Ketones, UA: NEGATIVE
Nitrite, UA: NEGATIVE
Protein,UA: NEGATIVE
Specific Gravity, UA: 1.013 (ref 1.005–1.030)
Urobilinogen, Ur: 0.2 mg/dL (ref 0.2–1.0)
pH, UA: 7 (ref 5.0–7.5)

## 2021-03-22 LAB — HCV AB W REFLEX TO QUANT PCR: HCV Ab: <0.1 s/co ratio (ref 0.0–0.9)

## 2021-03-22 LAB — BASIC METABOLIC PANEL
BUN/Creatinine Ratio: 18 (ref 9–23)
BUN: 10 mg/dL (ref 6–24)
CO2: 23 mmol/L (ref 20–29)
Calcium: 10 mg/dL (ref 8.7–10.2)
Chloride: 102 mmol/L (ref 96–106)
Creatinine, Ser: 0.56 mg/dL — ABNORMAL LOW (ref 0.57–1.00)
Glucose: 76 mg/dL (ref 70–99)
Potassium: 4.3 mmol/L (ref 3.5–5.2)
Sodium: 142 mmol/L (ref 134–144)
eGFR: 113 mL/min/{1.73_m2} (ref >59)

## 2021-03-22 LAB — MICROSCOPIC EXAMINATION: Casts: NONE SEEN /lpf

## 2021-03-22 LAB — HCV INTERPRETATION

## 2021-03-28 ENCOUNTER — Other Ambulatory Visit: Payer: Self-pay

## 2021-03-28 ENCOUNTER — Other Ambulatory Visit: Payer: Self-pay | Admitting: Nurse Practitioner

## 2021-03-28 ENCOUNTER — Telehealth: Payer: Self-pay

## 2021-03-28 DIAGNOSIS — N390 Urinary tract infection, site not specified: Secondary | ICD-10-CM

## 2021-03-28 MED ORDER — NITROFURANTOIN MONOHYD MACRO 100 MG PO CAPS
100.0000 mg | ORAL_CAPSULE | Freq: Two times a day (BID) | ORAL | 0 refills | Status: AC
  Filled 2021-03-28: qty 10, 5d supply, fill #0

## 2021-03-28 NOTE — Telephone Encounter (Signed)
Called and left a vm to give office a call back with the help of interpretor Ivin Booty #[id number]

## 2021-03-28 NOTE — Telephone Encounter (Signed)
-----   Message from Claiborne Rigg, NP sent at 03/28/2021  8:14 AM EST ----- Negative for hepatitis C.  Kidney function and electrolytes are normal.  Urine shows possible urinary tract infection.  Antibiotic has been sent to the pharmacy for you to take.

## 2021-03-29 ENCOUNTER — Telehealth: Payer: Self-pay | Admitting: Nurse Practitioner

## 2021-03-29 NOTE — Telephone Encounter (Signed)
Duplicate request, see other encounter.   Copied from CRM [id number]. Topic: Quick Communication - Lab Results (Clinic Use ONLY) >> Mar 28, 2021  9:02 AM Vertis Kelch, CMA wrote: Called patient to inform them of  lab results. When patient returns call, triage nurse may disclose results. >> Mar 28, 2021  9:40 AM Marylen Ponto wrote: Pt returned call for lab results. Pt requests call back. Pt will need an interpreter

## 2021-03-31 ENCOUNTER — Other Ambulatory Visit: Payer: Self-pay

## 2021-03-31 NOTE — Telephone Encounter (Signed)
Using [facility] Vernona Rieger QP#[id number]. Pt given lab results per notes of Bertram Denver, NP on 03/28/21. Pt verbalized understanding. She asks about the test for cancer that she mentioned to Zelda at the last OV 03/21/21. I advised I don't see any labs for that, so I will send this question to Zelda and someone will call back with her recommendation.

## 2021-03-31 NOTE — Telephone Encounter (Signed)
Has patient returned the stool test? If so results for colon cancer screening are not back yet

## 2021-04-05 NOTE — Telephone Encounter (Signed)
Left message to return call to our office. With help from [facility] [phone]. Trying to find out if pt brought back stool sample.

## 2021-05-12 ENCOUNTER — Other Ambulatory Visit: Payer: Self-pay

## 2021-07-11 ENCOUNTER — Other Ambulatory Visit: Payer: Self-pay

## 2021-09-05 ENCOUNTER — Other Ambulatory Visit: Payer: Self-pay

## 2021-09-14 ENCOUNTER — Encounter: Payer: Self-pay | Admitting: Physician Assistant

## 2021-09-14 ENCOUNTER — Ambulatory Visit (INDEPENDENT_AMBULATORY_CARE_PROVIDER_SITE_OTHER): Payer: Self-pay | Admitting: Physician Assistant

## 2021-09-14 ENCOUNTER — Other Ambulatory Visit: Payer: Self-pay

## 2021-09-14 VITALS — BP 129/85 | HR 64 | Temp 98.7°F | Resp 18 | Ht 60.0 in | Wt 139.0 lb

## 2021-09-14 DIAGNOSIS — B360 Pityriasis versicolor: Secondary | ICD-10-CM

## 2021-09-14 DIAGNOSIS — B350 Tinea barbae and tinea capitis: Secondary | ICD-10-CM

## 2021-09-14 MED ORDER — NYSTATIN 100000 UNIT/GM EX OINT
1.0000 "application " | TOPICAL_OINTMENT | Freq: Two times a day (BID) | CUTANEOUS | 0 refills | Status: DC
  Filled 2021-09-14: qty 30, 15d supply, fill #0

## 2021-09-14 MED ORDER — KETOCONAZOLE 2 % EX SHAM
1.0000 "application " | MEDICATED_SHAMPOO | CUTANEOUS | 3 refills | Status: DC
  Filled 2021-09-14: qty 120, 25d supply, fill #0
  Filled 2021-09-30: qty 120, 25d supply, fill #1

## 2021-09-14 NOTE — Patient Instructions (Signed)
You will use the prescription cream twice a day on the white patches until resolved.  I do encourage you to use the medicated shampoo twice a week to help prevent further outbreaks. ? ?Roney Jaffe, PA-C ?Physician Assistant ?[facility] ?[email or web address] ? ? ?Pitiriasis versicolor ?Tinea Versicolor ? ?La pitiriasis versicolor es una infecci?n por hongos frecuente. Produce una erupci?n que se manifiesta como manchas claras u oscuras en la piel. Es m?s frecuente que aparezca en la piel del pecho, la espalda, el cuello o la parte superior de [place]. Esta afecci?n es m?s frecuente durante los meses de calor. ?La pitiriasis versicolor generalmente no causa ning?n otro problema aparte de la erupci?n. En la mayor?a de los casos, la infecci?n desaparece en unas pocas semanas con un tratamiento. Las [employer] de la piel pueden demorar algunos meses en recuperar su color habitual. ??Cu?les son las causas? ?Esta afecci?n se produce cuando comienza a proliferar un tipo de hongo (Malassezia furfur) que normalmente est? presente en la piel. Este hongo es un tipo de Midwife. Esta afecci?n no se transmite de [place] persona a otra (no es contagiosa). ??Qu? incrementa el riesgo? ?Esta afecci?n es m?s probable en presencia de determinados factores, como los siguientes: ?Calor y humedad. ?Sudoraci?n excesiva. ?Cambios hormonales, como los que se producen al tomar p?ldoras anticonceptivas. ?Linnell Fulling. ?Un sistema que combate las enfermedades (sistemainmunitario) debilitado. ??Cu?les son los signos o s?ntomas? ?Los s?ntomas de esta afecci?n incluyen: ?Una erupci?n de manchas claras u oscuras en la piel. La erupci?n cut?nea puede consistir en lo siguiente: ?Zonas con manchas de color rosa o canela (sobre piel clara). ?Zonas con manchas de color blanco o marr?n (sobre piel oscura). ?Zonas de piel con manchas que no se broncean. ?Bordes bien definidos. ?Escamas en las zonas  pigmentadas. ?Picaz?n leve. Es posible que no haya picaz?n. ??C?mo se diagnostica? ?Por lo general, el m?dico puede diagnosticar esta afecci?n al observarle la piel. Durante el examen, el m?dico puede usar luz ultravioleta (UV) para ver qu? cantidad de la piel est? afectada. En algunos casos, puede tomarse [place] de piel raspando la zona de la erupci?n cut?nea. Esta muestra se analizar? con el microscopio para Landscape architect proliferaci?n de Copywriter, advertising. ??C?mo se trata? ?El tratamiento de esta afecci?n puede incluir lo siguiente: ?Champ? anticaspa que se aplica sobre la piel afectada al tomar Neomia Dear ducha o un ba?o de inmersi?n. ?Jabones, lociones o cremas medicinales para la piel, de venta libre. ?Medicamentos antimic?ticos recetados en forma de comprimidos o crema para la piel. ?Medicamentos que ayudan a Technical sales engineer picaz?n. ?Siga estas indicaciones en su casa: ?Use los medicamentos de venta libre y los recetados solamente como se lo haya indicado el m?dico. ?Aplique el champ? anticaspa en la zona afectada como se lo haya indicado el m?dico. ?No se rasque la zona de piel afectada. ?Evite las condiciones de calor y humedad. ?No use cabinas de bronceado. ?Trate de Financial planner. ?Comun?quese con un m?dico si: ?Sus s?ntomas empeoran. ?Tiene fiebre. ?Tiene signos de infecci?n, por ejemplo: ?Enrojecimiento, hinchaz?n o dolor en el lugar de la erupci?n. ?Calor que proviene de la erupci?n. ?L?quido, sangre o pus que salen de la erupci?n. ?Pus o mal olor que salen de la erupci?n. ?La erupci?n cut?nea regresa (es recurrente) despu?s del tratamiento. ?La erupci?n no mejora con el tratamiento y se extiende a otras partes del cuerpo. ?Resumen ?La pitiriasis versicolor es una infecci?n por hongos frecuente en la piel. Produce una erupci?n que se manifiesta como manchas claras u  oscuras en la piel. ?Es m?s frecuente que aparezca en la piel del pecho, la espalda, el cuello o la parte superior de [place]. ?Por lo  general, el m?dico puede diagnosticar esta afecci?n al observarle la piel. ?El tratamiento puede incluir aplicar champ? en la piel y tomar o aplicarse medicamentos. ?Esta informaci?n no tiene Theme park manager el consejo del m?dico. Aseg?rese de hacerle al m?dico cualquier pregunta que tenga. ?Document Revised: 08/03/2020 Document Reviewed: 08/03/2020 ?Elsevier Patient Education ? 2023 Elsevier Inc. ? ?

## 2021-09-14 NOTE — Progress Notes (Signed)
? ?Established Patient Office Visit ? ?Subjective   ?Patient ID: Colleen Warner, female    DOB: November 14, 1975  Age: 46 y.o. MRN: [id number] ? ?Chief Complaint  ?Patient presents with  ? Rash  ?  Neck-Itch  ? ? ?States that she has been having white spots on her neck since March, states that they are itchy.  States that she has had previous spots like this on her scalp and has previously been prescribed ketoconazole shampoo.  States that she has been out of this shampoo for several months. ? ?Denies any new detergents, lotions, body washes, fragrances, medications. ? ?States that she does use Rubie Maid products and states that she will use a Rubie Maid product to help with the itching. ? ?Due to language barrier, an interpreter was present during the history-taking and subsequent discussion (and for part of the physical exam) with this patient. ? ? ? ?Past Medical History:  ?Diagnosis Date  ? Allergic rhinitis 06/09/2014  ? Anemia   ? Bartholin cyst 10/04/2012  ? Bloating 12/23/2012  ? Carpal tunnel syndrome, bilateral 06/09/2014  ? Kidney stones   ? LLQ abdominal pain 03/09/2015  ? Mental disorder   ? depression  ? No pertinent past medical history   ? Right flank pain 04/21/2013  ? Tinea corporis 01/21/2016  ? UTI (urinary tract infection)   ? ?Social History  ? ?Socioeconomic History  ? Marital status: Married  ?  Spouse name: Not on file  ? Number of children: Not on file  ? Years of education: Not on file  ? Highest education level: Not on file  ?Occupational History  ? Not on file  ?Tobacco Use  ? Smoking status: Never  ? Smokeless tobacco: Never  ?Vaping Use  ? Vaping Use: Never used  ?Substance and Sexual Activity  ? Alcohol use: Not Currently  ?  Comment: occasional - ETOH with pregnancy  ? Drug use: No  ? Sexual activity: Yes  ?  Birth control/protection: Pill  ?Other Topics Concern  ? Not on file  ?Social History Narrative  ? Not on file  ? ?Social Determinants of Health  ? ?Financial Resource Strain: Not on file   ?Food Insecurity: Not on file  ?Transportation Needs: Not on file  ?Physical Activity: Not on file  ?Stress: Not on file  ?Social Connections: Not on file  ?Intimate Partner Violence: Not on file  ? ?Family History  ?Problem Relation Age of Onset  ? Cancer Father   ? Alzheimer's disease Mother   ? ?Allergies  ?Allergen Reactions  ? Pineapple Anaphylaxis  ? ?  ? ?Review of Systems  ?Constitutional: Negative.   ?HENT: Negative.    ?Eyes: Negative.   ?Respiratory:  Negative for shortness of breath.   ?Cardiovascular:  Negative for chest pain.  ?Gastrointestinal: Negative.   ?Genitourinary: Negative.   ?Musculoskeletal: Negative.   ?Skin:  Positive for itching and rash.  ?Neurological: Negative.   ?Endo/Heme/Allergies: Negative.   ? ?  ?Objective:  ?  ? ?BP 129/85 (BP Location: Right Arm, Patient Position: Sitting, Cuff Size: Normal)   Pulse 64   Temp 98.7 ?F (37.1 ?C) (Oral)   Resp 18   Ht 5' (1.524 m)   Wt 139 lb (63 kg)   LMP 09/04/2021   SpO2 99%   BMI 27.15 kg/m?  ? ? ?Physical Exam ?Vitals and nursing note reviewed.  ?Constitutional:   ?   Appearance: Normal appearance.  ?HENT:  ?   Head: Normocephalic and atraumatic.  ?  Right Ear: External ear normal.  ?   Left Ear: External ear normal.  ?   Nose: Nose normal.  ?   Mouth/Throat:  ?   Mouth: Mucous membranes are moist.  ?   Pharynx: Oropharynx is clear.  ?Eyes:  ?   Extraocular Movements: Extraocular movements intact.  ?   Conjunctiva/sclera: Conjunctivae normal.  ?   Pupils: Pupils are equal, round, and reactive to light.  ?Cardiovascular:  ?   Rate and Rhythm: Normal rate and regular rhythm.  ?   Pulses: Normal pulses.  ?   Heart sounds: Normal heart sounds.  ?Pulmonary:  ?   Effort: Pulmonary effort is normal.  ?   Breath sounds: Normal breath sounds.  ?Musculoskeletal:     ?   General: Normal range of motion.  ?   Cervical back: Normal range of motion and neck supple.  ?Skin: ?   General: Skin is warm and dry.  ?   Findings: Rash is not crusting,  pustular or vesicular.  ?   Comments: White flaky patches noted on both sides of neck, one at forehead hairline.  ?Neurological:  ?   General: No focal deficit present.  ?   Mental Status: She is alert and oriented to person, place, and time.  ?Psychiatric:     ?   Mood and Affect: Mood normal.     ?   Behavior: Behavior normal.     ?   Thought Content: Thought content normal.     ?   Judgment: Judgment normal.  ? ? ? ? ?  ?Assessment & Plan:  ? ?Problem List Items Addressed This Visit   ? ?  ? Musculoskeletal and Integument  ? Tinea capitis  ? Relevant Medications  ? ketoconazole (NIZORAL) 2 % shampoo (Start on 09/15/2021)  ? nystatin ointment (MYCOSTATIN)  ? ?Other Visit Diagnoses   ? ? Tinea versicolor    -  Primary  ? Relevant Medications  ? ketoconazole (NIZORAL) 2 % shampoo (Start on 09/15/2021)  ? nystatin ointment (MYCOSTATIN)  ? ?  ?1. Tinea versicolor ?Trial Mycostatin, patient education given on continuing use of Nizoral for maintenance.  Patient education given on supportive care.  Red flags given for prompt reevaluation. ?- ketoconazole (NIZORAL) 2 % shampoo; Apply 1 application. topically 2 (two) times a week.  Dispense: 120 mL; Refill: 3 ?- nystatin ointment (MYCOSTATIN); Apply 1 application. topically 2 (two) times daily.  Dispense: 30 g; Refill: 0 ? ?2. Tinea capitis ? ?- ketoconazole (NIZORAL) 2 % shampoo; Apply 1 application. topically 2 (two) times a week.  Dispense: 120 mL; Refill: 3 ? ? ? ?I have reviewed the patient's medical history (PMH, PSH, Social History, Family History, Medications, and allergies) , and have been updated if relevant. I spent 30 minutes reviewing chart and  face to face time with patient. ? ? ?Return if symptoms worsen or fail to improve.  ? ? ?Paulett Kaufhold S Mayers, PA-C ? ?

## 2021-09-14 NOTE — Progress Notes (Signed)
Patient has not eaten today. ?Patient has had vitamins today. ?Patient reports pain in the L side beginning right now due to being nervous ?Patient reports rash on the neck beginning in March and wrapping around the neck. The area itches and white spots on the neck. ?Patient request refill of shampoo. ?

## 2021-09-27 ENCOUNTER — Other Ambulatory Visit: Payer: Self-pay

## 2021-09-27 DIAGNOSIS — N644 Mastodynia: Secondary | ICD-10-CM

## 2021-09-30 ENCOUNTER — Other Ambulatory Visit: Payer: Self-pay

## 2021-10-17 ENCOUNTER — Encounter: Payer: Self-pay | Admitting: Nurse Practitioner

## 2021-10-17 ENCOUNTER — Other Ambulatory Visit: Payer: Self-pay | Admitting: Nurse Practitioner

## 2021-10-17 ENCOUNTER — Ambulatory Visit: Payer: Self-pay | Attending: Nurse Practitioner | Admitting: Nurse Practitioner

## 2021-10-17 ENCOUNTER — Other Ambulatory Visit: Payer: Self-pay

## 2021-10-17 VITALS — BP 107/73 | HR 68 | Temp 98.0°F | Resp 12 | Ht <= 58 in | Wt 139.0 lb

## 2021-10-17 DIAGNOSIS — Z30019 Encounter for initial prescription of contraceptives, unspecified: Secondary | ICD-10-CM

## 2021-10-17 DIAGNOSIS — R7303 Prediabetes: Secondary | ICD-10-CM

## 2021-10-17 DIAGNOSIS — E559 Vitamin D deficiency, unspecified: Secondary | ICD-10-CM

## 2021-10-17 DIAGNOSIS — R5383 Other fatigue: Secondary | ICD-10-CM

## 2021-10-17 DIAGNOSIS — Z1211 Encounter for screening for malignant neoplasm of colon: Secondary | ICD-10-CM

## 2021-10-17 LAB — POCT GLYCOSYLATED HEMOGLOBIN (HGB A1C): Hemoglobin A1C: 5.2 % (ref 4.0–5.6)

## 2021-10-17 LAB — POCT CBG (FASTING - GLUCOSE)-MANUAL ENTRY: Glucose Fasting, POC: 100 mg/dL — AB (ref 70–99)

## 2021-10-17 MED ORDER — NORTREL 0.5/35 (28) 0.5-35 MG-MCG PO TABS
1.0000 | ORAL_TABLET | Freq: Every day | ORAL | 11 refills | Status: DC
  Filled 2021-10-17 – 2021-10-27 (×2): qty 28, 28d supply, fill #0
  Filled 2021-11-21: qty 28, 28d supply, fill #1
  Filled 2021-12-23: qty 84, 84d supply, fill #2
  Filled 2022-03-20: qty 84, 84d supply, fill #3
  Filled 2022-06-12: qty 84, 84d supply, fill #4
  Filled 2022-09-04: qty 28, 28d supply, fill #5

## 2021-10-17 NOTE — Progress Notes (Signed)
Assessment & Plan:  Colleen Warner was seen today for prediabetes.  Diagnoses and all orders for this visit:  Prediabetes -     Glucose (CBG), Fasting -     HgB A1c -     CMP14+EGFR  Fatigue, unspecified type -     Thyroid Panel With TSH -     CBC -     VITAMIN D 25 Hydroxy (Vit-D Deficiency, Fractures) -     CMP14+EGFR  Colon cancer screening -     Fecal occult blood, imunochemical([facility]/Sunquest)  Vitamin D deficiency disease -     VITAMIN D 25 Hydroxy (Vit-D Deficiency, Fractures)  Encounter for female birth control -     norethindrone-ethinyl estradiol (NORTREL 0.5/35, 28,) 0.5-35 MG-MCG tablet; Take 1 tablet by mouth daily.    Patient has been counseled on age-appropriate routine health concerns for screening and prevention. These are reviewed and up-to-date. Referrals have been placed accordingly. Immunizations are up-to-date or declined.    Subjective:   Chief Complaint  Patient presents with   Prediabetes   HPI Colleen Warner 46 y.o. female presents to office today for follow up to prediabetes. She endorses worsening fatigue over the past few months. Denies any insomnia, increased stress, anxiety or change in dietary intake. Last menstrual period 09-26-2021. No unintentional weight loss   VRI was used to communicate directly with patient for the entire encounter including providing detailed patient instructions.    Prediabetes Well controlled with weight management and diet at this time.  Lab Results  Component Value Date   HGBA1C 5.2 10/17/2021     Review of Systems  Constitutional:  Positive for malaise/fatigue. Negative for chills, diaphoresis, fever and weight loss.  HENT: Negative.  Negative for nosebleeds.   Eyes: Negative.  Negative for blurred vision, double vision and photophobia.  Respiratory: Negative.  Negative for cough and shortness of breath.   Cardiovascular: Negative.  Negative for chest pain, palpitations and leg swelling.   Gastrointestinal: Negative.  Negative for heartburn, nausea and vomiting.  Musculoskeletal: Negative.  Negative for myalgias.  Neurological: Negative.  Negative for dizziness, focal weakness, seizures and headaches.  Psychiatric/Behavioral: Negative.  Negative for suicidal ideas.     Past Medical History:  Diagnosis Date   Allergic rhinitis 06/09/2014   Anemia    Bartholin cyst 10/04/2012   Bloating 12/23/2012   Carpal tunnel syndrome, bilateral 06/09/2014   Kidney stones    LLQ abdominal pain 03/09/2015   Mental disorder    depression   No pertinent past medical history    Right flank pain 04/21/2013   Tinea corporis 01/21/2016   UTI (urinary tract infection)     Past Surgical History:  Procedure Laterality Date   KIDNEY STONE SURGERY     7 years ago-[facility] [place]    Family History  Problem Relation Age of Onset   Cancer Father    Alzheimer's disease Mother     Social History Reviewed with no changes to be made today.   Outpatient Medications Prior to Visit  Medication Sig Dispense Refill   norethindrone-ethinyl estradiol (NORTREL 0.5/35, 28,) 0.5-35 MG-MCG tablet Take 1 tablet by mouth daily for 28 days. 28 tablet 11   ketoconazole (NIZORAL) 2 % shampoo Apply 1 application. topically 2 (two) times a week. (Patient not taking: Reported on 10/17/2021) 120 mL 3   nystatin ointment (MYCOSTATIN) Apply 1 application. topically 2 (two) times daily. (Patient not taking: Reported on 10/17/2021) 30 g 0   No facility-administered medications prior to visit.  Allergies  Allergen Reactions   Pineapple Anaphylaxis       Objective:    BP 107/73 (BP Location: Left Arm, Patient Position: Sitting, Cuff Size: Normal)   Pulse 68   Temp 98 F (36.7 C) (Oral)   Resp 12   Ht $R'4\' 10"'uo$  (1.473 m)   Wt 139 lb (63 kg)   LMP 09/26/2021 (Approximate)   SpO2 98%   BMI 29.05 kg/m  Wt Readings from Last 3 Encounters:  10/17/21 139 lb (63 kg)  09/14/21 139 lb (63 kg)  03/21/21 143 lb 8 oz  (65.1 kg)    Physical Exam Vitals and nursing note reviewed.  Constitutional:      Appearance: She is well-developed.  HENT:     Head: Normocephalic and atraumatic.  Cardiovascular:     Rate and Rhythm: Normal rate and regular rhythm.     Heart sounds: Normal heart sounds. No murmur heard.    No friction rub. No gallop.  Pulmonary:     Effort: Pulmonary effort is normal. No tachypnea or respiratory distress.     Breath sounds: Normal breath sounds. No decreased breath sounds, wheezing, rhonchi or rales.  Chest:     Chest wall: No tenderness.  Abdominal:     General: Bowel sounds are normal.     Palpations: Abdomen is soft.  Musculoskeletal:        General: Normal range of motion.     Cervical back: Normal range of motion.  Skin:    General: Skin is warm and dry.  Neurological:     Mental Status: She is alert and oriented to person, place, and time.     Coordination: Coordination normal.  Psychiatric:        Behavior: Behavior normal. Behavior is cooperative.        Thought Content: Thought content normal.        Judgment: Judgment normal.          Patient has been counseled extensively about nutrition and exercise as well as the importance of adherence with medications and regular follow-up. The patient was given clear instructions to go to ER or return to medical center if symptoms don't improve, worsen or new problems develop. The patient verbalized understanding.   Follow-up: Return in about 6 months (around 04/18/2022).   [name], FNP-BC [facility] and [place], [place]   10/17/2021, 1:31 PM

## 2021-10-17 NOTE — Progress Notes (Signed)
F/u prediabetes CBG- 100 A1C- 5.2

## 2021-10-18 LAB — CMP14+EGFR
ALT: 10 IU/L (ref 0–32)
AST: 14 IU/L (ref 0–40)
Albumin/Globulin Ratio: 1.6 (ref 1.2–2.2)
Albumin: 4.2 g/dL (ref 3.8–4.8)
Alkaline Phosphatase: 59 IU/L (ref 44–121)
BUN/Creatinine Ratio: 14 (ref 9–23)
BUN: 9 mg/dL (ref 6–24)
Bilirubin Total: 0.4 mg/dL (ref 0.0–1.2)
CO2: 21 mmol/L (ref 20–29)
Calcium: 9.1 mg/dL (ref 8.7–10.2)
Chloride: 101 mmol/L (ref 96–106)
Creatinine, Ser: 0.63 mg/dL (ref 0.57–1.00)
Globulin, Total: 2.6 g/dL (ref 1.5–4.5)
Glucose: 89 mg/dL (ref 70–99)
Potassium: 4.4 mmol/L (ref 3.5–5.2)
Sodium: 136 mmol/L (ref 134–144)
Total Protein: 6.8 g/dL (ref 6.0–8.5)
eGFR: 110 mL/min/{1.73_m2} (ref >59)

## 2021-10-18 LAB — CBC
Hematocrit: 37.2 % (ref 34.0–46.6)
Hemoglobin: 12.4 g/dL (ref 11.1–15.9)
MCH: 28.9 pg (ref 26.6–33.0)
MCHC: 33.3 g/dL (ref 31.5–35.7)
MCV: 87 fL (ref 79–97)
Platelets: 241 10*3/uL (ref 150–450)
RBC: 4.29 x10E6/uL (ref 3.77–5.28)
RDW: 13.1 % (ref 11.7–15.4)
WBC: 7.5 10*3/uL (ref 3.4–10.8)

## 2021-10-18 LAB — THYROID PANEL WITH TSH
Free Thyroxine Index: 1.5 (ref 1.2–4.9)
T3 Uptake Ratio: 15 % — ABNORMAL LOW (ref 24–39)
T4, Total: 10 ug/dL (ref 4.5–12.0)
TSH: 1.29 u[IU]/mL (ref 0.450–4.500)

## 2021-10-18 LAB — VITAMIN D 25 HYDROXY (VIT D DEFICIENCY, FRACTURES): Vit D, 25-Hydroxy: 30.4 ng/mL (ref 30.0–100.0)

## 2021-10-27 ENCOUNTER — Other Ambulatory Visit: Payer: Self-pay

## 2021-11-10 ENCOUNTER — Ambulatory Visit: Payer: Self-pay | Admitting: *Deleted

## 2021-11-10 ENCOUNTER — Ambulatory Visit
Admission: RE | Admit: 2021-11-10 | Discharge: 2021-11-10 | Disposition: A | Discharge status: Home / Self Care | Payer: No Typology Code available for payment source | Source: Ambulatory Visit | Attending: Obstetrics and Gynecology | Admitting: Obstetrics and Gynecology

## 2021-11-10 VITALS — BP 106/70 | Wt 138.7 lb

## 2021-11-10 DIAGNOSIS — N644 Mastodynia: Secondary | ICD-10-CM

## 2021-11-10 DIAGNOSIS — Z1239 Encounter for other screening for malignant neoplasm of breast: Secondary | ICD-10-CM

## 2021-11-10 NOTE — Patient Instructions (Addendum)
Explained breast self awareness with [employer]. Patient did not need a Pap smear today due to last Pap smear and HPV typing was 03/12/2020. Let her know [facility] will cover Pap smears and HPV typing every 5 years unless has a history of abnormal Pap smears. Referred patient to the [facility] for a diagnostic mammogram. Appointment scheduled Thursday, November 10, 2021 at 1030. Patient aware of appointment and will be there. [name] verbalized understanding.  Colleen Warner, Colleen Maser, RN 9:44 AM

## 2021-11-10 NOTE — Progress Notes (Signed)
[name] is a 46 y.o. female who presents to [facility] clinic today with complaint of left outer breast pain x 8 months that comes and goes. Patient rates the pain at a 6 out of 10..    Pap Smear: Pap smear not completed today. Last Pap smear was 03/12/2020 at [facility] and was normal with negative HPV. Per patient has no history of an abnormal Pap smear. Last Pap smear result is available in Epic.   Physical exam: Breasts Breasts symmetrical. No skin abnormalities bilateral breasts. No nipple retraction bilateral breasts. No nipple discharge bilateral breasts. No lymphadenopathy. No lumps palpated bilateral breasts. Complaints of left outer breast pain on exam.      MS DIGITAL DIAG TOMO BILAT  Result Date: 11/10/2021 CLINICAL DATA:  The patient presents with focal left breast pain. EXAM: DIGITAL DIAGNOSTIC BILATERAL MAMMOGRAM WITH TOMOSYNTHESIS AND CAD; ULTRASOUND LEFT BREAST LIMITED TECHNIQUE: Bilateral digital diagnostic mammography and breast tomosynthesis was performed. The images were evaluated with computer-aided detection.; Targeted ultrasound examination of the left breast was performed. COMPARISON:  Previous exam(s). ACR Breast Density Category c: The breast tissue is heterogeneously dense, which may obscure small masses. FINDINGS: No suspicious masses, calcifications, or distortion identified in either breast. Targeted ultrasound is performed, showing no sonographic correlate with the patient's pain. IMPRESSION: No mammographic or sonographic evidence of malignancy. RECOMMENDATION: Treatment of the patient's pain should be based on clinical and physical exam given lack of imaging findings. Recommend annual screening mammography. I have discussed the findings and recommendations with the patient. If applicable, a reminder letter will be sent to the patient regarding the next appointment. BI-RADS CATEGORY  2: Benign. Electronically Signed   By:  Gerome Sam III M.D.   On: 11/10/2021 11:52  MS DIGITAL SCREENING TOMO BILATERAL  Result Date: 05/21/2020 CLINICAL DATA:  Screening. EXAM: DIGITAL SCREENING BILATERAL MAMMOGRAM WITH TOMO AND CAD COMPARISON:  Previous exam(s). ACR Breast Density Category c: The breast tissue is heterogeneously dense, which may obscure small masses. FINDINGS: There are no findings suspicious for malignancy. The images were evaluated with computer-aided detection. IMPRESSION: No mammographic evidence of malignancy. A result letter of this screening mammogram will be mailed directly to the patient. RECOMMENDATION: Screening mammogram in one year. (Code:SM-B-01Y) BI-RADS CATEGORY  1: Negative. Electronically Signed   By: Harmon Pier M.D.   On: 05/21/2020 09:23   MS DIGITAL SCREENING TOMO BILATERAL  Result Date: 07/03/2018 CLINICAL DATA:  Screening. EXAM: DIGITAL SCREENING BILATERAL MAMMOGRAM WITH TOMO AND CAD COMPARISON:  Previous exam(s). ACR Breast Density Category c: The breast tissue is heterogeneously dense, which may obscure small masses. FINDINGS: There are no findings suspicious for malignancy. Images were processed with CAD. IMPRESSION: No mammographic evidence of malignancy. A result letter of this screening mammogram will be mailed directly to the patient. RECOMMENDATION: Screening mammogram in one year. (Code:SM-B-01Y) BI-RADS CATEGORY  1: Negative. Electronically Signed   By: Amie Portland M.D.   On: 07/03/2018 14:17    Pelvic/Bimanual Pap is not indicated today per BCCCP guidelines.   Smoking History: Patient has never smoked.   Patient Navigation: Patient education provided. Access to services provided for patient through [place] program. Spanish interpreter Natale Lay from [facility]  provided.   Colorectal Cancer Screening: Per patient has never had colonoscopy completed. Per patient FIT Test was given to her by PCP 10/17/2021. Explained the importance of completing FIT Test with patient. No  complaints today.    Breast and Cervical Cancer Risk  Assessment: Patient does not have family history of breast cancer, known genetic mutations, or radiation treatment to the chest before age 76. Patient does not have history of cervical dysplasia, immunocompromised, or DES exposure in-utero.  Risk Assessment     Risk Scores       11/10/2021 05/20/2020   Last edited by: Narda Rutherford, LPN Meryl Dare, CMA   5-year risk: 0.6 % 0.5 %   Lifetime risk: 5.8 % 6 %            A: BCCCP exam without pap smear Complaint of left breast pain.  P: Referred patient to the [facility] for a diagnostic mammogram. Appointment scheduled Thursday, November 10, 2021 at 1030.  Priscille Heidelberg, RN 11/10/2021 9:44 AM

## 2021-11-21 ENCOUNTER — Other Ambulatory Visit: Payer: Self-pay

## 2021-12-23 ENCOUNTER — Other Ambulatory Visit: Payer: Self-pay

## 2022-03-20 ENCOUNTER — Other Ambulatory Visit: Payer: Self-pay

## 2022-06-12 ENCOUNTER — Other Ambulatory Visit: Payer: Self-pay

## 2022-09-04 ENCOUNTER — Other Ambulatory Visit: Payer: Self-pay

## 2022-09-05 ENCOUNTER — Other Ambulatory Visit: Payer: Self-pay

## 2022-09-28 ENCOUNTER — Ambulatory Visit: Payer: Self-pay | Attending: Physician Assistant | Admitting: Physician Assistant

## 2022-09-28 ENCOUNTER — Other Ambulatory Visit: Payer: Self-pay

## 2022-09-28 ENCOUNTER — Encounter: Payer: Self-pay | Admitting: Physician Assistant

## 2022-09-28 VITALS — BP 125/84 | HR 68 | Wt 144.4 lb

## 2022-09-28 DIAGNOSIS — R7303 Prediabetes: Secondary | ICD-10-CM

## 2022-09-28 DIAGNOSIS — R519 Headache, unspecified: Secondary | ICD-10-CM

## 2022-09-28 DIAGNOSIS — H539 Unspecified visual disturbance: Secondary | ICD-10-CM

## 2022-09-28 DIAGNOSIS — Z30019 Encounter for initial prescription of contraceptives, unspecified: Secondary | ICD-10-CM

## 2022-09-28 DIAGNOSIS — N6320 Unspecified lump in the left breast, unspecified quadrant: Secondary | ICD-10-CM

## 2022-09-28 MED ORDER — KETOROLAC TROMETHAMINE 60 MG/2ML IM SOLN
60.0000 mg | Freq: Once | INTRAMUSCULAR | Status: AC
  Administered 2022-09-28: 60 mg via INTRAMUSCULAR

## 2022-09-28 MED ORDER — NORTREL 0.5/35 (28) 0.5-35 MG-MCG PO TABS
1.0000 | ORAL_TABLET | Freq: Every day | ORAL | 11 refills | Status: DC
  Filled 2022-09-28: qty 28, 28d supply, fill #0
  Filled 2022-10-25 (×2): qty 28, 28d supply, fill #1
  Filled 2022-11-21: qty 28, 28d supply, fill #2
  Filled 2022-12-27: qty 84, 84d supply, fill #3
  Filled 2023-03-16 (×2): qty 84, 84d supply, fill #4
  Filled 2023-05-21 (×2): qty 84, 84d supply, fill #5

## 2022-09-28 NOTE — Progress Notes (Signed)
Patient ID: Colleen Warner, female   DOB: October 04, 1975, 47 y.o.   MRN: [id number]   Colleen Warner, is a 47 y.o. female  [id number]  [id number]  DOB - 1975/09/09  Chief Complaint  Patient presents with   Headache   Mass   Dizziness       Subjective:   Colleen Warner is a 47 y.o. female here today for a HA on the R side of her head.  She has had a HA like this before but this HA won't go all the way away with the normal measures she does such as tylenol. No vision changes.  No thunder clap.  No FH aneurysm.  Needs BC RF No problems updated.  ALLERGIES: Allergies  Allergen Reactions   Pineapple Anaphylaxis    PAST MEDICAL HISTORY: Past Medical History:  Diagnosis Date   Allergic rhinitis 06/09/2014   Anemia    Bartholin cyst 10/04/2012   Bloating 12/23/2012   Carpal tunnel syndrome, bilateral 06/09/2014   Kidney stones    LLQ abdominal pain 03/09/2015   Mental disorder    depression   No pertinent past medical history    Right flank pain 04/21/2013   Tinea corporis 01/21/2016   UTI (urinary tract infection)     MEDICATIONS AT HOME: Prior to Admission medications   Medication Sig Start Date End Date Taking? Authorizing Provider  norethindrone-ethinyl estradiol (NORTREL 0.5/35, 28,) 0.5-35 MG-MCG tablet Take 1 tablet by mouth daily. 09/28/22   Anders Simmonds, PA-C  tiZANidine (ZANAFLEX) 4 MG tablet Take 1 tablet (4 mg total) by mouth every 8 (eight) hours as needed. 01/24/23   Hoy Register, MD    ROS: Neg HEENT Neg resp Neg cardiac Neg GI Neg GU Neg MS Neg psych   Objective:   Vitals:   09/28/22 1110  BP: 125/84  Pulse: 68  SpO2: 100%  Weight: 144 lb 6.4 oz (65.5 kg)   Exam General appearance : Awake, alert, not in any distress. Speech Clear. Not toxic looking HEENT: Atraumatic and Normocephalic, pupils equally reactive to light and accomodation, fundi benign, PERRLA Neck: Supple, no JVD. No cervical lymphadenopathy.   Chest: Good air entry bilaterally, CTAB.  No rales/rhonchi/wheezing B breasts examined with no palpable area of concern CVS: S1 S2 regular, no murmurs.  Extremities: B/L Lower Ext shows no edema, both legs are warm to touch Neurology: Awake alert, and oriented X 3, CN II-XII intact, Non focal Skin: No Rash  Data Review Lab Results  Component Value Date   HGBA1C 5.7 (H) 09/28/2022   HGBA1C 5.2 10/17/2021   HGBA1C 5.5 01/13/2021    Assessment & Plan   1. Encounter for female birth control Meds RF.  Due for pap 03/2023 - norethindrone-ethinyl estradiol (NORTREL 0.5/35, 28,) 0.5-35 MG-MCG tablet; Take 1 tablet by mouth daily.  Dispense: 28 tablet; Refill: 11  2. Prediabetes I have had a lengthy discussion and provided education about insulin resistance and the intake of too much sugar/refined carbohydrates.  I have advised the patient to work at a goal of eliminating sugary drinks, candy, desserts, sweets, refined sugars, processed foods, and white carbohydrates.  The patient expresses understanding.   - Hemoglobin A1c  3. Right-sided headache Resolved with toradol - ketorolac (TORADOL) injection 60 mg - TSH  4. Mass of left breast, unspecified quadrant Normal breast exam - MS 3D DIAG MAMMO BILAT BR (aka MM); Future  5. Vision changes - Ambulatory referral to Ophthalmology    Return if symptoms worsen or fail to  improve.  The patient was given clear instructions to go to ER or return to medical center if symptoms don't improve, worsen or new problems develop. The patient verbalized understanding. The patient was told to call to get lab results if they haven't heard anything in the next week.      Georgian Co, PA-C [facility] [place], [place] [phone]   01/24/2023, 4:15 PM

## 2022-09-29 ENCOUNTER — Other Ambulatory Visit: Payer: Self-pay | Admitting: Physician Assistant

## 2022-09-29 DIAGNOSIS — N6320 Unspecified lump in the left breast, unspecified quadrant: Secondary | ICD-10-CM

## 2022-09-29 LAB — TSH: TSH: 0.948 u[IU]/mL (ref 0.450–4.500)

## 2022-09-29 LAB — HEMOGLOBIN A1C
Est. average glucose Bld gHb Est-mCnc: 117 mg/dL
Hgb A1c MFr Bld: 5.7 % — ABNORMAL HIGH (ref 4.8–5.6)

## 2022-10-11 ENCOUNTER — Telehealth: Payer: Self-pay | Admitting: *Deleted

## 2022-10-11 NOTE — Telephone Encounter (Signed)
Pt given lab results per [facility] interpreter ID # [phone] per notes of Cena Benton, PA from 09/29/22 on 10/11/22. Pt verbalized understanding   Please call patient.  Her labs indicate prediabetes.  Please tell her I recommend decreasing sugars and starches to reduce risk of developing diabetes.  Thyroid is normal.  Thanks, Georgian Co, PA-C

## 2022-10-20 ENCOUNTER — Encounter: Payer: Self-pay | Admitting: *Deleted

## 2022-10-25 ENCOUNTER — Other Ambulatory Visit: Payer: Self-pay | Admitting: Physician Assistant

## 2022-10-25 ENCOUNTER — Other Ambulatory Visit: Payer: Self-pay

## 2022-10-25 DIAGNOSIS — Z30019 Encounter for initial prescription of contraceptives, unspecified: Secondary | ICD-10-CM

## 2022-11-21 ENCOUNTER — Other Ambulatory Visit: Payer: Self-pay

## 2022-12-27 ENCOUNTER — Other Ambulatory Visit: Payer: Self-pay

## 2023-01-23 ENCOUNTER — Ambulatory Visit: Payer: Self-pay | Admitting: *Deleted

## 2023-01-23 NOTE — Telephone Encounter (Signed)
Per agent: Summary:  "Possible kidney infection, seeking advice   Pt has strong odor when she urinates, experiencing pain in her lower back and it radiates down. She has been to the emergency room before this kind of pain and she is concerned it could possibly be her kidneys again  Appt tomorrow with Dr. Alvis Lemmings but seeking advice today"         Chief Complaint: Dysuria Symptoms: Burning with urination, frequency, urgency, urine malodorous. Lower back pain, radiates into buttocks.  Frequency: Thursday Pertinent Negatives: Patient denies retention, chills "No fever." Disposition: [] ED /[] Urgent Care (no appt availability in office) / [x] Appointment(In office/virtual)/ []  [place] [facility]/ [] Home Care/ [] Refused Recommended Disposition /[] [place] [facility]/ []  Follow-up with PCP Additional Notes:  Appt secured for tomorrow by agent prior to triage. Care advise provided, pt verbalizes understanding. Assisted by Barnabas Lister, # [phone]  Reason for Disposition  Side (flank) or lower back pain present  Answer Assessment - Initial Assessment Questions 1. SYMPTOM: "What's the main symptom you're concerned about?" (e.g., frequency, incontinence)     Back pain 2. ONSET: "When did the    start?"     Thursday.  3. PAIN: "Is there any pain?" If Yes, ask: "How bad is it?" (Scale: 1-10; mild, moderate, severe)     Yes, burning with urination, back pain 4. CAUSE: "What do you think is causing the symptoms?"     UTI 5. OTHER SYMPTOMS: "Do you have any other symptoms?" (e.g., blood in urine, fever, flank pain, pain with urination)     Urgency, lower back pain, chills,urine malodorous.  Protocols used: Urinary Symptoms-A-AH

## 2023-01-24 ENCOUNTER — Other Ambulatory Visit (HOSPITAL_COMMUNITY)
Admission: RE | Admit: 2023-01-24 | Discharge: 2023-01-24 | Disposition: A | Discharge status: Home / Self Care | Payer: Self-pay | Source: Ambulatory Visit | Attending: Family Medicine | Admitting: Family Medicine

## 2023-01-24 ENCOUNTER — Encounter: Payer: Self-pay | Admitting: Family Medicine

## 2023-01-24 ENCOUNTER — Ambulatory Visit: Payer: Self-pay | Attending: Family Medicine | Admitting: Family Medicine

## 2023-01-24 ENCOUNTER — Other Ambulatory Visit: Payer: Self-pay

## 2023-01-24 VITALS — BP 121/77 | HR 81 | Ht <= 58 in | Wt 146.2 lb

## 2023-01-24 DIAGNOSIS — R8290 Unspecified abnormal findings in urine: Secondary | ICD-10-CM

## 2023-01-24 DIAGNOSIS — R109 Unspecified abdominal pain: Secondary | ICD-10-CM

## 2023-01-24 DIAGNOSIS — R1032 Left lower quadrant pain: Secondary | ICD-10-CM

## 2023-01-24 LAB — POCT URINALYSIS DIP (CLINITEK)
Bilirubin, UA: NEGATIVE
Glucose, UA: NEGATIVE mg/dL
Ketones, POC UA: NEGATIVE mg/dL
Leukocytes, UA: NEGATIVE
Nitrite, UA: NEGATIVE
POC PROTEIN,UA: NEGATIVE
Spec Grav, UA: 1.01 (ref 1.010–1.025)
Urobilinogen, UA: 0.2 E.U./dL
pH, UA: 6 (ref 5.0–8.0)

## 2023-01-24 MED ORDER — TIZANIDINE HCL 4 MG PO TABS
4.0000 mg | ORAL_TABLET | Freq: Three times a day (TID) | ORAL | 0 refills | Status: DC | PRN
  Filled 2023-01-24: qty 60, 20d supply, fill #0

## 2023-01-24 NOTE — Progress Notes (Signed)
Subjective:  Patient ID: Colleen Warner, female    DOB: [birth year]  Age: 47 y.o. MRN: [id number]  CC: urine odor (Lower back pain)   HPI Colleen Warner is a 47 y.o. year old female Patient of Bertram Denver FNP here for an acute visit.  Interval History: Discussed the use of AI scribe software for clinical note transcription with the patient, who gave verbal consent to proceed.  She presents with a 3-4 day history of malodorous urine and lower left flank pain radiating anteriorly to her LLQ. The urine odor has improved and is now clear. She also experienced a burning sensation during urination, which has lessened with increased water intake. The lower back pain, which radiates, remains constant. She also reports intermittent headaches and chills.  She does provide a remote history of passing a kidney stone.       Past Medical History:  Diagnosis Date   Allergic rhinitis 06/09/2014   Anemia    Bartholin cyst 10/04/2012   Bloating 12/23/2012   Carpal tunnel syndrome, bilateral 06/09/2014   Kidney stones    LLQ abdominal pain 03/09/2015   Mental disorder    depression   No pertinent past medical history    Right flank pain 04/21/2013   Tinea corporis 01/21/2016   UTI (urinary tract infection)     Past Surgical History:  Procedure Laterality Date   KIDNEY STONE SURGERY     7 years ago-[place]    Family History  Problem Relation Age of Onset   Cancer Father    Alzheimer's disease Mother     Social History   Socioeconomic History   Marital status: Married    Spouse name: Not on file   Number of children: 3   Years of education: Not on file   Highest education level: 6th grade  Occupational History   Not on file  Tobacco Use   Smoking status: Never   Smokeless tobacco: Never  Vaping Use   Vaping status: Never Used  Substance and Sexual Activity   Alcohol use: Not Currently    Comment: occasional - ETOH with pregnancy   Drug use: No   Sexual  activity: Yes    Birth control/protection: Pill  Other Topics Concern   Not on file  Social History Narrative   Not on file   Social Determinants of Health   Financial Resource Strain: Not on file  Food Insecurity: No Food Insecurity (11/10/2021)   Hunger Vital Sign    Worried About Running Out of Food in the Last Year: Never true    Ran Out of Food in the Last Year: Never true  Transportation Needs: No Transportation Needs (11/10/2021)   PRAPARE - Administrator, Civil Service (Medical): No    Lack of Transportation (Non-Medical): No  Physical Activity: Not on file  Stress: Not on file  Social Connections: Not on file    Allergies  Allergen Reactions   Pineapple Anaphylaxis    Outpatient Medications Prior to Visit  Medication Sig Dispense Refill   norethindrone-ethinyl estradiol (NORTREL 0.5/35, 28,) 0.5-35 MG-MCG tablet Take 1 tablet by mouth daily. 28 tablet 11   No facility-administered medications prior to visit.     ROS Review of Systems  Constitutional:  Negative for activity change and appetite change.  HENT:  Negative for sinus pressure and sore throat.   Respiratory:  Negative for chest tightness, shortness of breath and wheezing.   Cardiovascular:  Negative for chest pain and palpitations.  Gastrointestinal:  Negative for abdominal distention, abdominal pain and constipation.  Genitourinary:  Positive for flank pain.  Musculoskeletal:  Positive for back pain.  Psychiatric/Behavioral:  Negative for behavioral problems and dysphoric mood.     Objective:  BP 121/77   Pulse 81   Ht 4\' 10"  (1.473 m)   Wt 146 lb 3.2 oz (66.3 kg)   SpO2 100%   BMI 30.56 kg/m      01/24/2023    1:45 PM 09/28/2022   11:10 AM 11/10/2021    9:25 AM  BP/Weight  Systolic BP 121 125 106  Diastolic BP 77 84 70  Wt. (Lbs) 146.2 144.4 138.7  BMI 30.56 kg/m2 30.18 kg/m2 28.99 kg/m2      Physical Exam Constitutional:      Appearance: She is well-developed.   Cardiovascular:     Rate and Rhythm: Normal rate.     Heart sounds: Normal heart sounds. No murmur heard. Pulmonary:     Effort: Pulmonary effort is normal.     Breath sounds: Normal breath sounds. No wheezing or rales.  Chest:     Chest wall: No tenderness.  Abdominal:     General: Bowel sounds are normal. There is no distension.     Palpations: Abdomen is soft. There is no mass.     Tenderness: There is no abdominal tenderness. There is left CVA tenderness.  Musculoskeletal:        General: Normal range of motion.     Right lower leg: No edema.     Left lower leg: No edema.  Neurological:     Mental Status: She is alert and oriented to person, place, and time.  Psychiatric:        Mood and Affect: Mood normal.        Latest Ref Rng & Units 10/17/2021   11:18 AM 03/21/2021    3:25 PM 01/13/2021   12:14 PM  CMP  Glucose 70 - 99 mg/dL 89  76  78   BUN 6 - 24 mg/dL 9  10  9    Creatinine 0.57 - 1.00 mg/dL 4.09  8.11  9.14   Sodium 134 - 144 mmol/L 136  142  138   Potassium 3.5 - 5.2 mmol/L 4.4  4.3  3.9   Chloride 96 - 106 mmol/L 101  102  99   CO2 20 - 29 mmol/L 21  23  24    Calcium 8.7 - 10.2 mg/dL 9.1  78.2  9.8   Total Protein 6.0 - 8.5 g/dL 6.8   6.9   Total Bilirubin 0.0 - 1.2 mg/dL 0.4   0.7   Alkaline Phos 44 - 121 IU/L 59   64   AST 0 - 40 IU/L 14   18   ALT 0 - 32 IU/L 10   15     Lipid Panel     Component Value Date/Time   CHOL 189 01/13/2021 1214   TRIG 213 (H) 01/13/2021 1214   HDL 59 01/13/2021 1214   CHOLHDL 3.2 01/13/2021 1214   LDLCALC 94 01/13/2021 1214    CBC    Component Value Date/Time   WBC 7.5 10/17/2021 1118   WBC 10.7 (H) 07/16/2020 0925   RBC 4.29 10/17/2021 1118   RBC 4.26 07/16/2020 0925   HGB 12.4 10/17/2021 1118   HCT 37.2 10/17/2021 1118   PLT 241 10/17/2021 1118   MCV 87 10/17/2021 1118   MCH 28.9 10/17/2021 1118   MCH 29.3 07/16/2020 0925  MCHC 33.3 10/17/2021 1118   MCHC 33.2 07/16/2020 0925   RDW 13.1 10/17/2021  1118   LYMPHSABS 2.6 01/13/2021 1214   MONOABS 0.5 07/16/2020 0925   EOSABS 0.0 01/13/2021 1214   BASOSABS 0.0 01/13/2021 1214    Lab Results  Component Value Date   HGBA1C 5.7 (H) 09/28/2022    Assessment & Plan:      Hematuria Urinalysis showed blood but no UTI.  Patient has a history of passing a kidney stone and is currently experiencing lower back pain. -Order CT scan to evaluate for kidney stones.  Flank Pain Patient reports pain is a 7/10 and worsens when lying on her stomach. -Prescribe muscle relaxant as needed for pain.  Malodorous urine Patient reported strong urine odor, which has since improved.  -Perform vaginal swab to rule out vaginal infection.          Meds ordered this encounter  Medications   tiZANidine (ZANAFLEX) 4 MG tablet    Sig: Take 1 tablet (4 mg total) by mouth every 8 (eight) hours as needed.    Dispense:  60 tablet    Refill:  0    Follow-up: Return in about 1 month (around 02/23/2023) for Medical conditions with PCP - hematuria.       Hoy Register, MD, FAAFP. [facility] [place], [place] [phone]   01/24/2023, 3:30 PM

## 2023-01-24 NOTE — Patient Instructions (Signed)
Hematuria en adultos Hematuria, Adult La hematuria es la presencia de sangre en la orina. La sangre puede ser visible en la orina o se puede identificar con un anlisis. La causa de esta afeccin puede ser infecciones de la vejiga, la uretra, el rin o la prstata. Otras causas posibles incluyen lo siguiente: Clculos renales. Cncer de las vas urinarias. Exceso de calcio en la orina. Enfermedades que se transmiten de padres a hijos (enfermedades hereditarias). Ejercicios que Patent examiner. Normalmente, las infecciones pueden tratarse con medicamentos, y los clculos renales, por lo general, se eliminarn a travs de la orina. Si ninguna de estas es la causa de la hematuria, quizs sea Passenger transport manager ms estudios para identificar la causa de los sntomas que presenta. Es muy importante que le informe a su mdico si ve sangre en la orina, incluso si no tiene dolor o si el sangrado desaparece sin tratamiento. La sangre en la orina, que desaparece y vuelve a Research officer, trade union, puede ser un sntoma de una enfermedad muy grave, incluido Management consultant. No se manifiesta dolor en las etapas iniciales de muchos tipos de cncer urinarios. Siga estas instrucciones en su casa: Medicamentos Use los medicamentos de venta libre y los recetados solamente como se lo haya indicado el mdico. Si le recetaron un antibitico, tmelo como se lo haya indicado el mdico. No deje de tomar el antibitico aunque comience a sentirse mejor. Comida y bebida Beba suficiente lquido como para Pharmacologist la orina de color amarillo plido. Se recomienda que beba de 3 a 4 cuartos de galn (de 2.8 a 3.8 l) por da. Si le han diagnosticado una infeccin, se recomienda beber jugo de Kimberly, adems de grandes cantidades de agua. Evite la cafena, el t y las [address] Estas sustancias tienden a Surveyor, minerals. Evite el alcohol, ya que Recruitment consultant prstata (en los hombres). Indicaciones generales Si le han diagnosticado  clculos renales, siga las instrucciones de su mdico con respecto a filtrar la orina para rescatar el clculo. Vace la vejiga con frecuencia. Evite retener la orina durante largos perodos. Si es mujer: Despus de una deposicin, higiencese desde adelante hacia atrs y use cada trozo de papel higinico por nica vez. Vace la vejiga antes y despus de Management consultant. Est atento a cualquier cambio en los sntomas. Informe al mdico sobre cualquier cambio o sntoma nuevo. Recoger los [employer] de las pruebas es su responsabilidad. Consulte al mdico o pregunte en el departamento donde se realiza la prueba cundo estarn [employer]. Cumpla con todas las visitas de seguimiento. Esto es importante. Comunquese con un mdico si: Siente dolor en la espalda. Tiene fiebre o escalofros. Tiene nuseas o vmitos. Los sntomas no mejoran despus de [address]. Los sntomas empeoran. Solicite ayuda de inmediato si: Presenta muchos vmitos y no puede tomar los medicamentos sin vomitar. Siente dolor intenso en la espalda o el abdomen, a pesar de [employer]. Tiene una gran cantidad de sangre en la orina. Despide cogulos de [employer]. Se siente muy dbil o como si fuera a desmayarse. Se desmaya. Resumen La hematuria es la presencia de sangre en la orina. Puede tener muchas causas posibles. Es muy importante que le informe a su mdico si ve sangre en la orina, incluso si no tiene dolor o si el sangrado desaparece sin tratamiento. Use los medicamentos de venta libre y los recetados solamente como se lo haya indicado el mdico. Beba suficiente lquido como para Pharmacologist la [place] de  color amarillo plido. Esta informacin no tiene Theme park manager el consejo del mdico. Asegrese de hacerle al mdico cualquier pregunta que tenga. Document Revised: 02/16/2020 Document Reviewed: 01/05/2020 Elsevier Patient Education  2024 [employer].

## 2023-01-25 LAB — CERVICOVAGINAL ANCILLARY ONLY
Bacterial Vaginitis (gardnerella): NEGATIVE
Candida Glabrata: NEGATIVE
Candida Vaginitis: NEGATIVE
Chlamydia: NEGATIVE
Comment: NEGATIVE
Comment: NEGATIVE
Comment: NEGATIVE
Comment: NEGATIVE
Comment: NEGATIVE
Comment: NORMAL
Neisseria Gonorrhea: NEGATIVE
Trichomonas: NEGATIVE

## 2023-02-01 ENCOUNTER — Ambulatory Visit
Admission: RE | Admit: 2023-02-01 | Discharge: 2023-02-01 | Disposition: A | Discharge status: Home / Self Care | Payer: Self-pay | Source: Ambulatory Visit | Attending: Family Medicine | Admitting: Family Medicine

## 2023-02-01 DIAGNOSIS — R1032 Left lower quadrant pain: Secondary | ICD-10-CM | POA: Insufficient documentation

## 2023-02-13 ENCOUNTER — Other Ambulatory Visit: Payer: Self-pay | Admitting: Family Medicine

## 2023-02-13 DIAGNOSIS — N200 Calculus of kidney: Secondary | ICD-10-CM

## 2023-02-13 DIAGNOSIS — K8020 Calculus of gallbladder without cholecystitis without obstruction: Secondary | ICD-10-CM

## 2023-02-14 ENCOUNTER — Telehealth: Payer: Self-pay

## 2023-02-14 NOTE — Telephone Encounter (Signed)
Pt given lab results per notes of Dr. Alvis Lemmings on 02/14/23. Pt verbalized understanding. Pt has already received call from Gen Surgery and pt didn't understand why, advised of CT results from 02/01/23 from Dr. Alvis Lemmings as well. Pt verbalized understanding and will await call from Urology too.   Please inform her that her CT scan reveals presence of kidney stones and I referred her to the urologist for further management.  She also has gallstones but no evidence of infection of gallbladder.  I referred her to a general surgeon for further management.

## 2023-02-19 ENCOUNTER — Encounter: Payer: Self-pay | Admitting: Surgery

## 2023-02-19 ENCOUNTER — Ambulatory Visit (INDEPENDENT_AMBULATORY_CARE_PROVIDER_SITE_OTHER): Payer: Self-pay | Admitting: Surgery

## 2023-02-19 VITALS — BP 112/76 | HR 72 | Temp 98.2°F | Ht <= 58 in | Wt 146.6 lb

## 2023-02-19 DIAGNOSIS — K8020 Calculus of gallbladder without cholecystitis without obstruction: Secondary | ICD-10-CM

## 2023-02-19 NOTE — Progress Notes (Signed)
02/19/2023  Reason for Visit:  Cholelithiasis  Requesting Provider:  Hoy Register, MD  History of Present Illness: Colleen Warner is a 47 y.o. female presenting for evaluation of cholelithiasis.  The patient reports that recently she's been experiencing left flank and back pain in association with burning/discomfort sensation with urination as well as noticing blood in her urine.  She saw Dr. Alvis Lemmings on 01/24/23 for this and a CT renal stone study was ordered.  She does have a history of kidney stones in the past and had a right ureter laser lithotripsy with stent placement in 2015 at [facility].  In her CT from 02/01/2023, she was fount to have non-obstructing bilateral kidney stones, but also gallstones.  She has known about her gallstones already and prior to her lithotripsy she also saw general surgery for it.  At the time, it was thought that her symptoms were more related to her kidney stones and no cholecystectomy was offered.  The patient reports that from time to time, she may also have a bit of right upper quadrant pain/discomfort, but this is short lasting.  During those episodes she also has some loose stools.  But otherwise denies any constant/frequent symptoms in epigastric or RUQ areas.  Past Medical History: Past Medical History:  Diagnosis Date   Allergic rhinitis 06/09/2014   Anemia    Bartholin cyst 10/04/2012   Bloating 12/23/2012   Carpal tunnel syndrome, bilateral 06/09/2014   Kidney stones    LLQ abdominal pain 03/09/2015   Mental disorder    depression   No pertinent past medical history    Right flank pain 04/21/2013   Tinea corporis 01/21/2016   UTI (urinary tract infection)      Past Surgical History: Past Surgical History:  Procedure Laterality Date   KIDNEY STONE SURGERY Right 2015    Home Medications: Prior to Admission medications   Medication Sig Start Date End Date Taking? Authorizing Provider  norethindrone-ethinyl estradiol (NORTREL  0.5/35, 28,) 0.5-35 MG-MCG tablet Take 1 tablet by mouth daily. 09/28/22  Yes Anders Simmonds, PA-C  tiZANidine (ZANAFLEX) 4 MG tablet Take 1 tablet (4 mg total) by mouth every 8 (eight) hours as needed. 01/24/23  Yes Hoy Register, MD    Allergies: Allergies  Allergen Reactions   Pineapple Anaphylaxis    Social History:  reports that she has never smoked. She has never used smokeless tobacco. She reports that she does not currently use alcohol. She reports that she does not use drugs.   Family History: Family History  Problem Relation Age of Onset   Cancer Father    Alzheimer's disease Mother     Review of Systems: Review of Systems  Constitutional:  Negative for chills and fever.  Respiratory:  Negative for shortness of breath.   Cardiovascular:  Negative for chest pain.  Gastrointestinal:  Negative for nausea and vomiting.  Genitourinary:  Positive for flank pain.  Skin:  Negative for rash.    Physical Exam BP 112/76 (BP Location: Right Arm, Patient Position: Sitting, Cuff Size: Small)   Pulse 72   Temp 98.2 F (36.8 C) (Oral)   Ht 4\' 10"  (1.473 m)   Wt 146 lb 9.6 oz (66.5 kg)   SpO2 98%   BMI 30.64 kg/m  CONSTITUTIONAL: No acute distress, well nourished. HEENT:  Normocephalic, atraumatic, extraocular motion intact.  RESPIRATORY:  Normal respiratory effort without pathologic use of accessory muscles. CARDIOVASCULAR: Regular rhythm and rate GI: The abdomen is soft, non-distended, currently without any  pain in epigastric and/or RUQ area.  She does have some discomfort in the left flank, but no acute peritonitis.  MUSCULOSKELETAL:  Normal muscle strength and tone in all four extremities.  No peripheral edema or cyanosis. NEUROLOGIC:  Motor and sensation is grossly normal.  Cranial nerves are grossly intact. PSYCH:  Alert and oriented to person, place and time. Affect is normal.  Laboratory Analysis: No results found for this or any previous visit (from the past 24  hour(s)).  Imaging: CT renal stone on 02/01/23: IMPRESSION: 1. Bilateral nonobstructive renal stones measure up to 3 mm. No obstructive ureteral or bladder calculi identified. No hydronephrosis. 2. Cholelithiasis without findings of acute cholecystitis  Assessment and Plan: This is a 47 y.o. female with bilateral renal stones and cholelithiasis  --Discussed with the patient the types of symptoms that are associated with cholelithiasis, including RUQ or epigastric pain, nausea, and emesis.  The pain can radiate towards the back between the scapula.  However, flank pain and pelvic pain may be more related to kidney stones and hematuria and dysuria can also be related to that vs UTI.   She may be experiencing some symptoms from cholelithiasis but currently I think her main issue is the kidney stones.  She has a referral with Urology pending.  Discussed with her that after their workup/treatment is done, she should contact us so we can discuss better what residual symptoms she's still having that may be related to her gallbladder and if so, then we can schedule her for cholecystectomy.  In the meantime, recommended a low fat diet. --Patient is in agreement with this plan and all of her questions have been answered.  I spent 30 minutes dedicated to the care of this patient on the date of this encounter to include pre-visit review of records, face-to-face time with the patient discussing diagnosis and management, and any post-visit coordination of care.   Howie Ill, MD [facility]

## 2023-02-19 NOTE — Patient Instructions (Addendum)
Si los sntomas no mejoran despus de consultar al urlogo por clculos renales, llame a nuestro consultorio.  Plan de alimentacin para problemas de vescula biliar Gallbladder Eating Plan Un nivel alto de colesterol en la sangre, la obesidad, un estilo de vida sedentario, una dieta poco saludable y la diabetes son factores de riesgo para presentar clculos biliares. Si tiene una afeccin de la vescula biliar, puede tener problemas para digerir las grasas y Web designer una ingesta alta de grasas. Consumir una dieta con bajo contenido de grasas puede [employer] sntomas, y puede ser beneficiosa antes y despus de [place] de extraccin de vescula biliar (colecistectoma). El mdico puede recomendarle que trabaje con un nutricionista para que lo ayude a reducir la cantidad de grasas en su dieta. Cules son algunos consejos para seguir este plan? Pautas generales Limite el consumo de grasas a menos del 30 % del total de caloras diarias. Si usted ingiere alrededor de 1,800 caloras diarias, esto significa comer menos de 60 gramos (g) de Automotive engineer. La grasa es una parte importante de una dieta saludable. Consumir una dieta con bajo contenido de grasas puede dificultar mantener un peso corporal saludable. Pregunte a su nutricionista qu cantidad de grasas, caloras y otros nutrientes necesita diariamente. Haga comidas pequeas y frecuentes Freight forwarder de tres comidas abundantes. Beba de 8 a 10 vasos (1.9 a 2.4 litros) de lquido por da, como mnimo. Beber suficiente lquido como para Pharmacologist la orina de color amarillo plido. Si bebe alcohol: Limite la cantidad que bebe a lo siguiente: De 0 a 1 medida por da para las mujeres que no estn embarazadas. De 0 a 2 medidas por da para los hombres. Sepa cunta cantidad de alcohol hay en las bebidas. En los [address], una medida equivale a una botella de cerveza de 12 oz (355 ml), un vaso de vino de 5 oz (148 ml) o un vaso de una  bebida alcohlica de alta graduacin de 1 oz (44 ml). Lea las etiquetas de los alimentos  Consulte la informacin nutricional en las etiquetas de los alimentos para conocer la cantidad de grasas por porcin. Elija alimentos con menos de 3 gramos de grasas por porcin. Al ir de compras Elija alimentos saludables sin grasas o con bajo contenido de grasas. Busque las palabras "sin grasa", "bajo en grasas" o "con bajo contenido de grasas". Evite comprar alimentos procesados o preenvasados. Al cocinar Para cocinar opte por mtodos con bajo contenido de grasa, como hornear, hervir, Software engineer y Transport planner. Cocine con pequeas cantidades de grasas saludables, como aceite de [place], aceite de semilla de [place], aceite de [place], aceite de [address] o aceite de [place]. Qu alimentos se recomiendan?  Todas las frutas y verduras frescas, congeladas o enlatadas. Cereales integrales. Leche y yogur semidescremados y descremados. Vita [place], aves sin piel, pescado, huevos y legumbres. Suplementos proteicos con bajo contenido de grasas, en polvo o lquidos. Hierbas y especias. Es posible que los productos detallados arriba no constituyan una lista completa de los alimentos y las bebidas que puede tomar. Consulte a un nutricionista para obtener ms informacin. Qu alimentos no se recomiendan? Alimentos muy grasos. Entre estos se incluyen productos panificados, comida rpida, cortes de carne con grasa, helados, pan francs, rosquillas dulces, pizza, pan de queso, alimentos cubiertos con [place], salsas con crema o queso. Comidas fritas. Se incluyen papas fritas, tempura, pescado rebozado, milanesas de pollo, panes fritos y dulces. Alimentos que causan gases o meteorismo. Es posible que los productos que se  enumeran ms arriba no constituyan [employer] de los alimentos que debe evitar. Consulte a un nutricionista para obtener ms informacin. Resumen Una dieta de bajo contenido graso puede ser beneficiosa si  tiene una afeccin de la vescula biliar o puede hacerla antes y despus de someterse a una ciruga de vescula. Limite el consumo de grasas a menos del 30 % del total de caloras diarias. Esto es casi 60 gramos de grasa si usted ingiere 1,800 caloras diarias. Haga comidas pequeas y frecuentes Freight forwarder de tres comidas abundantes. Esta informacin no tiene Theme park manager el consejo del mdico. Asegrese de hacerle al mdico cualquier pregunta que tenga. Document Revised: 04/28/2021 Document Reviewed: 04/28/2021 Elsevier Patient Education  2024 [employer].

## 2023-02-23 ENCOUNTER — Ambulatory Visit (INDEPENDENT_AMBULATORY_CARE_PROVIDER_SITE_OTHER): Payer: Self-pay | Admitting: Urology

## 2023-02-23 ENCOUNTER — Other Ambulatory Visit: Payer: Self-pay

## 2023-02-23 ENCOUNTER — Other Ambulatory Visit
Admission: RE | Admit: 2023-02-23 | Discharge: 2023-02-23 | Disposition: A | Discharge status: Home / Self Care | Payer: Self-pay | Attending: Urology | Admitting: Urology

## 2023-02-23 VITALS — BP 114/76 | HR 81 | Ht <= 58 in | Wt 146.0 lb

## 2023-02-23 DIAGNOSIS — M549 Dorsalgia, unspecified: Secondary | ICD-10-CM

## 2023-02-23 DIAGNOSIS — N200 Calculus of kidney: Secondary | ICD-10-CM

## 2023-02-23 DIAGNOSIS — R350 Frequency of micturition: Secondary | ICD-10-CM

## 2023-02-23 LAB — URINALYSIS, COMPLETE (UACMP) WITH MICROSCOPIC
Bilirubin Urine: NEGATIVE
Glucose, UA: NEGATIVE mg/dL
Ketones, ur: NEGATIVE mg/dL
Leukocytes,Ua: NEGATIVE
Nitrite: NEGATIVE
Protein, ur: NEGATIVE mg/dL
Specific Gravity, Urine: 1.015 (ref 1.005–1.030)
pH: 7.5 (ref 5.0–8.0)

## 2023-02-23 LAB — BLADDER SCAN AMB NON-IMAGING: Scan Result: 308

## 2023-02-23 NOTE — Progress Notes (Signed)
Marcelle Overlie Plume,acting as a scribe for Vanna Scotland, MD.,have documented all relevant documentation on the behalf of Vanna Scotland, MD,as directed by  Vanna Scotland, MD while in the presence of Vanna Scotland, MD.  02/23/2023 2:33 PM   Colleen Warner 02/18/1976 [id number]  Referring provider: Hoy Register, MD [address] [place],  [place] [address]  Chief Complaint  Patient presents with   Nephrolithiasis    HPI: 47 year-old female who presents today for a follow up of kidney stones. She has a personal history of nephrolithiasis. She was seen 2 years ago with a left distal ureteral stone, which she intervally passed. She returns today worried about kidney stones.   Notably, she was seen by her family care provider last month complaining of left flank and lower quadrant pain. She is also concerned about the quality of her urine and urinary smell. A UA showed blood but no evidence of a UTI. There was concern that she was passing a stone and ordered a non-contrast CT stone protocol which I personally reviewed today that indicated some small bilateral non-obstructing stones up to 3 mm, but no significant overall stone burden.   Today, she reports having urgency frequency, lower abdominal pain, and burning with urination. She reports that the left flank pain is positional, worsening when lying face down, and describes it as a heavy sensation.   A spanish interpreter was utilized for the course of this appointment.   Results for orders placed or performed in visit on 02/23/23  Bladder Scan (Post Void Residual) in office  Result Value Ref Range   Scan Result 308 ml   Results for orders placed or performed during the hospital encounter of 02/23/23  Urinalysis, Complete w Microscopic -  Result Value Ref Range   Color, Urine YELLOW YELLOW   APPearance CLEAR CLEAR   Specific Gravity, Urine 1.015 1.005 - 1.030   pH 7.5 5.0 - 8.0   Glucose, UA NEGATIVE  NEGATIVE mg/dL   Hgb urine dipstick SMALL (A) NEGATIVE   Bilirubin Urine NEGATIVE NEGATIVE   Ketones, ur NEGATIVE NEGATIVE mg/dL   Protein, ur NEGATIVE NEGATIVE mg/dL   Nitrite NEGATIVE NEGATIVE   Leukocytes,Ua NEGATIVE NEGATIVE   Squamous Epithelial / HPF 0-5 0 - 5 /HPF   WBC, UA 0-5 0 - 5 WBC/hpf   RBC / HPF 0-5 0 - 5 RBC/hpf   Bacteria, UA FEW (A) NONE SEEN     PMH: Past Medical History:  Diagnosis Date   Allergic rhinitis 06/09/2014   Anemia    Bartholin cyst 10/04/2012   Bloating 12/23/2012   Carpal tunnel syndrome, bilateral 06/09/2014   Kidney stones    LLQ abdominal pain 03/09/2015   Mental disorder    depression   No pertinent past medical history    Right flank pain 04/21/2013   Tinea corporis 01/21/2016   UTI (urinary tract infection)     Surgical History: Past Surgical History:  Procedure Laterality Date   KIDNEY STONE SURGERY Right 2015    Home Medications:  Allergies as of 02/23/2023       Reactions   Pineapple Anaphylaxis        Medication List        Accurate as of February 23, 2023  2:33 PM. If you have any questions, ask your nurse or doctor.          STOP taking these medications    tiZANidine 4 MG tablet Commonly known as: Zanaflex Stopped by:  Vanna Scotland       TAKE these medications    Nortrel 0.5/35 (28) 0.5-35 MG-MCG tablet Generic drug: norethindrone-ethinyl estradiol [place] 1 tableta por va oral diariamente. (Take 1 tablet by mouth daily.)        Allergies:  Allergies  Allergen Reactions   Pineapple Anaphylaxis    Family History: Family History  Problem Relation Age of Onset   Cancer Father    Alzheimer's disease Mother     Social History:  reports that she has never smoked. She has never used smokeless tobacco. She reports that she does not currently use alcohol. She reports that she does not use drugs.   Physical Exam: BP 114/76   Pulse 81   Ht 4\' 10"  (1.473 m)   Wt 146 lb (66.2 kg)   BMI 30.51 kg/m    Constitutional:  Alert and oriented, No acute distress. HEENT:  AT, moist mucus membranes.  Trachea midline, no masses. Neurologic: Grossly intact, no focal deficits, moving all 4 extremities. Psychiatric: Normal mood and affect.   Pertinent Imaging: EXAM: CT ABDOMEN AND PELVIS WITHOUT CONTRAST  TECHNIQUE: Multidetector CT imaging of the abdomen and pelvis was performed following the standard protocol without IV contrast.  RADIATION DOSE REDUCTION: This exam was performed according to the departmental dose-optimization program which includes automated exposure control, adjustment of the mA and/or kV according to patient size and/or use of iterative reconstruction technique.  COMPARISON:  Multiple priors including CT July 16, 2020  FINDINGS: Lower chest: No acute abnormality.  Hepatobiliary: Unremarkable noncontrast enhanced appearance of the hepatic parenchyma. Cholelithiasis without findings of acute cholecystitis. No biliary ductal dilation.  Pancreas: No pancreatic ductal dilation or evidence of acute inflammation.  Spleen: No splenomegaly.  Adrenals/Urinary Tract: Bilateral adrenal glands appear normal. No hydronephrosis. Bilateral nonobstructive renal stones measure up to 3 mm. No obstructive ureteral or bladder calculi identified.  Stomach/Bowel: Stomach is unremarkable for degree of distension. No pathologic dilation of small or large bowel. Normal appendix. No evidence of acute bowel inflammation.  Vascular/Lymphatic: Normal caliber abdominal aorta. Smooth IVC contours. No pathologically enlarged abdominal or pelvic lymph nodes.  Reproductive: Uterus and bilateral adnexa are unremarkable.  Other: Trace pelvic free fluid is within physiologic normal limits.  Musculoskeletal: Mild multilevel degenerative changes spine.  IMPRESSION: 1. Bilateral nonobstructive renal stones measure up to 3 mm. No obstructive ureteral or bladder calculi identified.  No hydronephrosis. 2. Cholelithiasis without findings of acute cholecystitis.   Electronically Signed By: Maudry Mayhew M.D. On: 02/12/2023 16:02  This was personally reviewed and I agree with the radiologic interpretation.   Assessment & Plan:    1. Bilateral non-obstructing kidney stones - No evidence of an acute stone event or urinary obstruction - She has some very small bilateral stones that I do not feel warrant intervention given the relatively small size, essentially punctate - Encourage adequate hydration to facilitate natural passage of stones. - Plan for follow-up with an x-ray next year to monitor stone size and growth.  2. Incomplete bladder emptying - Advised to practice double voiding and lean forward while urinating to ensure complete bladder emptying. - Monitor for any signs of urinary tract infection or other complications.  3. Musculoskeletal back pain - The positional nature of the pain suggests a musculoskeletal cause, possibly related to the muscles along the spine. - Advised to monitor for any changes in pain and consider over-the-counter analgesics if needed. - Reassured her that the pain is unlikely related to kidney stones.  Return in about 1 year (around 02/23/2024) for repeat KUB to assess kidney stone status.  I have reviewed the above documentation for accuracy and completeness, and I agree with the above.   Vanna Scotland, MD   [facility] [address], [address] [place], [place] [address] [phone]  I spent 31 total minutes on the day of the encounter including pre-visit review of the medical record, face-to-face time with the patient, and post visit ordering of labs/imaging/tests.

## 2023-03-15 ENCOUNTER — Ambulatory Visit: Payer: Self-pay | Admitting: Urology

## 2023-03-16 ENCOUNTER — Other Ambulatory Visit: Payer: Self-pay

## 2023-03-16 ENCOUNTER — Other Ambulatory Visit (HOSPITAL_COMMUNITY): Payer: Self-pay

## 2023-03-20 ENCOUNTER — Other Ambulatory Visit: Payer: Self-pay

## 2023-05-21 ENCOUNTER — Other Ambulatory Visit (HOSPITAL_COMMUNITY): Payer: Self-pay

## 2023-05-21 ENCOUNTER — Other Ambulatory Visit: Payer: Self-pay

## 2023-05-23 ENCOUNTER — Other Ambulatory Visit: Payer: Self-pay

## 2023-09-13 ENCOUNTER — Other Ambulatory Visit: Payer: Self-pay

## 2023-09-13 ENCOUNTER — Other Ambulatory Visit: Payer: Self-pay | Admitting: Physician Assistant

## 2023-09-13 DIAGNOSIS — Z30019 Encounter for initial prescription of contraceptives, unspecified: Secondary | ICD-10-CM

## 2023-09-14 ENCOUNTER — Other Ambulatory Visit: Payer: Self-pay

## 2023-09-14 ENCOUNTER — Encounter: Payer: Self-pay | Admitting: Pharmacist

## 2023-09-14 ENCOUNTER — Other Ambulatory Visit: Payer: Self-pay | Admitting: Nurse Practitioner

## 2023-09-14 ENCOUNTER — Other Ambulatory Visit: Payer: Self-pay | Admitting: Physician Assistant

## 2023-09-14 DIAGNOSIS — Z30019 Encounter for initial prescription of contraceptives, unspecified: Secondary | ICD-10-CM

## 2023-09-14 NOTE — Telephone Encounter (Signed)
 Copied from CRM [id number]. Topic: Clinical - Medication Refill >> Sep 14, 2023  3:14 PM Sophia H wrote: Medication: norethindrone -ethinyl estradiol  (NORTREL  0.5/35, 28,) 0.5-35 MG-MCG tablet  Has the patient contacted their pharmacy? Yes (Agent: If no, request that the patient contact the pharmacy for the refill. If patient does not wish to contact the pharmacy document the reason why and proceed with request.) (Agent: If yes, when and what did the pharmacy advise?)  This is the patient's preferred pharmacy:  [facility] [address], [address] [place] [address] Phone: [phone] Fax: [phone]  Is this the correct pharmacy for this prescription? Yes If no, delete pharmacy and type the correct one.   Has the prescription been filled recently? No  Is the patient out of the medication? Yes  Has the patient been seen for an appointment in the last year OR does the patient have an upcoming appointment? Yes  Can we respond through MyChart? No  Agent: Please be advised that Rx refills may take up to 3 business days. We ask that you follow-up with your pharmacy.  **Pt scheduled appt for soonest available on June 16, pt request enough to get by till next appt.

## 2023-09-18 ENCOUNTER — Other Ambulatory Visit: Payer: Self-pay

## 2023-10-15 ENCOUNTER — Other Ambulatory Visit: Payer: Self-pay

## 2023-10-15 ENCOUNTER — Ambulatory Visit: Payer: Self-pay | Attending: Nurse Practitioner | Admitting: Nurse Practitioner

## 2023-10-15 ENCOUNTER — Encounter: Payer: Self-pay | Admitting: Nurse Practitioner

## 2023-10-15 VITALS — BP 115/78 | HR 68 | Resp 19 | Ht <= 58 in | Wt 144.4 lb

## 2023-10-15 DIAGNOSIS — M5442 Lumbago with sciatica, left side: Secondary | ICD-10-CM

## 2023-10-15 DIAGNOSIS — N760 Acute vaginitis: Secondary | ICD-10-CM

## 2023-10-15 DIAGNOSIS — Z30019 Encounter for initial prescription of contraceptives, unspecified: Secondary | ICD-10-CM

## 2023-10-15 DIAGNOSIS — N393 Stress incontinence (female) (male): Secondary | ICD-10-CM

## 2023-10-15 DIAGNOSIS — R7303 Prediabetes: Secondary | ICD-10-CM

## 2023-10-15 DIAGNOSIS — M5441 Lumbago with sciatica, right side: Secondary | ICD-10-CM

## 2023-10-15 MED ORDER — MELOXICAM 7.5 MG PO TABS
7.5000 mg | ORAL_TABLET | Freq: Every day | ORAL | 0 refills | Status: AC
  Filled 2023-10-15: qty 30, 30d supply, fill #0

## 2023-10-15 MED ORDER — NORTREL 0.5/35 (28) 0.5-35 MG-MCG PO TABS
1.0000 | ORAL_TABLET | Freq: Every day | ORAL | 11 refills | Status: DC
  Filled 2023-10-15: qty 28, 28d supply, fill #0

## 2023-10-15 MED ORDER — NORGESTIMATE-ETH ESTRADIOL 0.18/0.215/0.25 MG-25 MCG PO TABS
1.0000 | ORAL_TABLET | Freq: Every day | ORAL | 3 refills | Status: DC
  Filled 2023-10-15: qty 84, 84d supply, fill #0
  Filled 2024-01-08: qty 84, 84d supply, fill #1

## 2023-10-15 NOTE — Progress Notes (Signed)
 Assessment & Plan:  [name] was seen today for back pain, leg pain and urinary tract infection.  Diagnoses and all orders for this visit:  Midline low back pain with bilateral sciatica, unspecified chronicity -     meloxicam (MOBIC) 7.5 MG tablet; Take 1 tablet (7.5 mg total) by mouth daily. -     Ambulatory referral to Physical Therapy Work on losing weight to help reduce back pain. May alternate with heat and ice application for pain relief. May also alternate with acetaminophen  as prescribed for back pain. Other alternatives include massage, acupuncture and water aerobics.    Stress incontinence of urine -     Ambulatory referral to Physical Therapy Instructions given on kegel exercises to perform  Encounter for female birth control -     Norgestimate-Eth Estradiol  (ORTHO TRI-CYCLEN LO) 0.18/0.215/0.25 MG-25 MCG TABS; Take 1 tablet by mouth daily.  Acute vaginitis -     Urinalysis, Complete; Future -     Cervicovaginal ancillary only; Future  Prediabetes -     Hemoglobin A1c -     CMP14+EGFR    Patient has been counseled on age-appropriate routine health concerns for screening and prevention. These are reviewed and up-to-date. Referrals have been placed accordingly. Immunizations are up-to-date or declined.    Subjective:   Chief Complaint  Patient presents with   Back Pain   Leg Pain   Urinary Tract Infection    Vaginal dryness and urinary incontinence     Colleen Warner 48 y.o. female presents to office today for back and leg pain and requesting refill of OCPs.  VRI was used to communicate directly with patient for the entire encounter including providing detailed patient instructions.    She has a past medical history of Allergic rhinitis (06/09/2014), Anemia, Bartholin cyst (10/04/2012), Bloating (12/23/2012), Carpal tunnel syndrome, bilateral (06/09/2014), Kidney stones, LLQ abdominal pain (03/09/2015), Mental disorder, No pertinent past medical history, Right  flank pain (04/21/2013), Tinea corporis (01/21/2016), and UTI (urinary tract infection).   Back and leg pain Onset of pain 3-4 months. Pain is located in the lower back and hips. Unrelated to any trauma or injury. Pain described as stabbing. She has not taken anything to relieve the pain. Relieving factors: rest.  Aggravating factors: cleaning the house, sleeping.    Also has been experiencing bilateral hand and wrist numbness.  Onset of numbness started 4 weeks ago. Feels numbness at night as well.   Endorses stress incontinence. Aggravating factors:sneezing or coughing.  Sometimes she does notice blood on the tissue when she wipes after urination.   Vaginitis Endorses vaginal pain and irritation    Review of Systems  Constitutional:  Negative for fever, malaise/fatigue and weight loss.  HENT: Negative.  Negative for nosebleeds.   Eyes: Negative.  Negative for blurred vision, double vision and photophobia.  Respiratory: Negative.  Negative for cough and shortness of breath.   Cardiovascular: Negative.  Negative for chest pain, palpitations and leg swelling.  Gastrointestinal: Negative.  Negative for heartburn, nausea and vomiting.  Genitourinary:        SEE HPI  Musculoskeletal:  Positive for back pain and joint pain. Negative for myalgias.  Neurological: Negative.  Negative for dizziness, focal weakness, seizures and headaches.  Psychiatric/Behavioral: Negative.  Negative for suicidal ideas.     Past Medical History:  Diagnosis Date   Allergic rhinitis 06/09/2014   Anemia    Bartholin cyst 10/04/2012   Bloating 12/23/2012   Carpal tunnel syndrome, bilateral 06/09/2014   Kidney  stones    LLQ abdominal pain 03/09/2015   Mental disorder    depression   No pertinent past medical history    Right flank pain 04/21/2013   Tinea corporis 01/21/2016   UTI (urinary tract infection)     Past Surgical History:  Procedure Laterality Date   KIDNEY STONE SURGERY Right 2015    Family  History  Problem Relation Age of Onset   Cancer Father    Alzheimer's disease Mother     Social History Reviewed with no changes to be made today.   Outpatient Medications Prior to Visit  Medication Sig Dispense Refill   norethindrone -ethinyl estradiol  (NORTREL  0.5/35, 28,) 0.5-35 MG-MCG tablet Take 1 tablet by mouth daily. (Patient not taking: Reported on 10/15/2023) 28 tablet 11   No facility-administered medications prior to visit.    Allergies  Allergen Reactions   Pineapple Anaphylaxis       Objective:    BP 115/78 (BP Location: Left Arm, Patient Position: Sitting, Cuff Size: Normal)   Pulse 68   Resp 19   Ht 4' 10 (1.473 m)   Wt 144 lb 6.4 oz (65.5 kg)   LMP 10/15/2023 (Exact Date)   SpO2 100%   BMI 30.18 kg/m  Wt Readings from Last 3 Encounters:  10/15/23 144 lb 6.4 oz (65.5 kg)  02/23/23 146 lb (66.2 kg)  02/19/23 146 lb 9.6 oz (66.5 kg)    Physical Exam Vitals and nursing note reviewed.  Constitutional:      Appearance: She is well-developed.  HENT:     Head: Normocephalic and atraumatic.   Cardiovascular:     Rate and Rhythm: Normal rate and regular rhythm.     Heart sounds: Normal heart sounds. No murmur heard.    No friction rub. No gallop.  Pulmonary:     Effort: Pulmonary effort is normal. No tachypnea or respiratory distress.     Breath sounds: Normal breath sounds. No decreased breath sounds, wheezing, rhonchi or rales.  Chest:     Chest wall: No tenderness.  Abdominal:     General: Bowel sounds are normal.     Palpations: Abdomen is soft.   Musculoskeletal:        General: Normal range of motion.     Cervical back: Normal range of motion.   Skin:    General: Skin is warm and dry.   Neurological:     Mental Status: She is alert and oriented to person, place, and time.     Coordination: Coordination normal.   Psychiatric:        Behavior: Behavior normal. Behavior is cooperative.        Thought Content: Thought content normal.         Judgment: Judgment normal.          Patient has been counseled extensively about nutrition and exercise as well as the importance of adherence with medications and regular follow-up. The patient was given clear instructions to go to ER or return to medical center if symptoms don't improve, worsen or new problems develop. The patient verbalized understanding.   Follow-up: No follow-ups on file.   Collins Dean, FNP-BC [facility] [place], [place] [phone]   10/15/2023, 3:43 PM

## 2023-10-16 ENCOUNTER — Ambulatory Visit: Payer: Self-pay | Admitting: Internal Medicine

## 2023-10-16 LAB — CMP14+EGFR
ALT: 15 IU/L (ref 0–32)
AST: 22 IU/L (ref 0–40)
Albumin: 4.4 g/dL (ref 3.9–4.9)
Alkaline Phosphatase: 68 IU/L (ref 44–121)
BUN/Creatinine Ratio: 14 (ref 9–23)
BUN: 8 mg/dL (ref 6–24)
Bilirubin Total: 0.4 mg/dL (ref 0.0–1.2)
CO2: 21 mmol/L (ref 20–29)
Calcium: 9.5 mg/dL (ref 8.7–10.2)
Chloride: 103 mmol/L (ref 96–106)
Creatinine, Ser: 0.57 mg/dL (ref 0.57–1.00)
Globulin, Total: 2.4 g/dL (ref 1.5–4.5)
Glucose: 74 mg/dL (ref 70–99)
Potassium: 3.9 mmol/L (ref 3.5–5.2)
Sodium: 138 mmol/L (ref 134–144)
Total Protein: 6.8 g/dL (ref 6.0–8.5)
eGFR: 111 mL/min/{1.73_m2} (ref >59)

## 2023-10-16 LAB — HEMOGLOBIN A1C
Est. average glucose Bld gHb Est-mCnc: 114 mg/dL
Hgb A1c MFr Bld: 5.6 % (ref 4.8–5.6)

## 2023-10-17 ENCOUNTER — Other Ambulatory Visit: Payer: Self-pay

## 2023-10-18 NOTE — Telephone Encounter (Signed)
 Copied from CRM [id number]. Topic: General - Other >> Oct 18, 2023  2:42 PM Kevelyn M wrote:  Reason for CRM: Patient's Daughter returning call for a nurse.

## 2023-10-18 NOTE — Telephone Encounter (Unsigned)
 Copied from CRM [id number]. Topic: General - Other >> Oct 18, 2023  2:45 PM Kevelyn M wrote: Reason for CRM: Patient does not speak [place], she will need an interpreter or you can the [facility], her daughter [phone]

## 2023-12-18 ENCOUNTER — Encounter: Payer: Self-pay | Admitting: Urology

## 2024-01-08 ENCOUNTER — Other Ambulatory Visit: Payer: Self-pay

## 2024-01-08 ENCOUNTER — Other Ambulatory Visit (HOSPITAL_COMMUNITY): Payer: Self-pay

## 2024-01-10 ENCOUNTER — Other Ambulatory Visit: Payer: Self-pay

## 2024-02-13 DIAGNOSIS — N200 Calculus of kidney: Secondary | ICD-10-CM | POA: Insufficient documentation

## 2024-02-13 NOTE — Progress Notes (Signed)
   02/18/2024 4:05 PM   Colleen Warner April 03, 1976 [id number]  Reason for visit: Follow up nephrolithiasis   HPI: 48 y.o. female, initial follow up with me today, previously seen by Dr. Penne in October 2024 Patient was very pleasant Visit completed via in person Spanish interpreter No acute GU events Although she does endorse 46-month history of waxing/waning left flank pain No witnessed or confirmed stone events Interval KUB 02/18/24 - small LLP renal stones ~2-3 mm, small 2 mm Right interpolar stone (similar to prior)  She also mentions today several month history of new vaginal spotting.  She has not had gynecologic exam in several years.  Denies gross hematuria  Prior HPI: History of nephrolithiasis  -Spontaneously passed stone in 2022  -CT stone (2024)-small bilateral nonobstructive stones measuring up to 3 mm    Physical Exam: BP 113/80   Pulse (!) 52   Ht 5' 1 (1.549 m)   Wt 146 lb (66.2 kg)   BMI 27.59 kg/m    Constitutional:  Alert and oriented, No acute distress.  Laboratory Data: N/A  Pertinent Imaging: I have personally viewed and interpreted the KUB 02/18/24 - small LLP renal stones ~2-4 mm, small 2 mm Right interpolar stone (similar to prior).    Assessment & Plan:    Nephrolithiasis Assessment & Plan: History of nephrolithiasis  -Spontaneously passed stone in 2022  -CT stone (2024)-small bilateral nonobstructive stones measuring up to 3 mm - KUB (Oct 2025) - stable bilateral stones up to ~3-4 mm 6-mo hx of nonspecific left flank pain - likely MSK  I reviewed her recent abdominal x-ray, which is reassuring-stable bilateral nephrolithiasis up to 3 mm, appears similar to prior exam.   Today we reviewed the basic tenets of kidney stone management and prevention. We reviewed five broad recommendations:   - Adequate fluid intake >=2.5 L/day (goal urine output >=2 L/day). - Maintenance of normal dietary calcium (1,000-1,200 mg/day), avoid  excess calcium supplements - Reduction of sodium intake (<2 g/day) to lower urinary calcium excretion. - Moderation of oxalate-rich foods (nuts, spinach, beets, chocolate, tea)  - Limit animal protein (meat, fish, poultry); promote balanced diet with fruits/vegetables.   - Will order CT stone protocol considering left flank pain-although I suspect this is non-GU in origin considering chronicity  Orders: -     CT RENAL STONE STUDY; Future  Vaginal bleeding Assessment & Plan: 3 mo history of vaginal spotting w/ wiping  On PO estradiol    Will refer to gynecology for evaluation, has been >3 years since last exam.    Orders: -     Ambulatory referral to Gynecology       Colleen JONELLE Skye, MD  [facility] [address], [address] [place], [place] [address] [phone]

## 2024-02-13 NOTE — Assessment & Plan Note (Addendum)
 History of nephrolithiasis  -Spontaneously passed stone in 2022  -CT stone (2024)-small bilateral nonobstructive stones measuring up to 3 mm - KUB (Oct 2025) - stable bilateral stones up to ~3-4 mm 6-mo hx of nonspecific left flank pain - likely MSK  I reviewed her recent abdominal x-ray, which is reassuring-stable bilateral nephrolithiasis up to 3 mm, appears similar to prior exam.   Today we reviewed the basic tenets of kidney stone management and prevention. We reviewed five broad recommendations:   - Adequate fluid intake >=2.5 L/day (goal urine output >=2 L/day). - Maintenance of normal dietary calcium (1,000-1,200 mg/day), avoid excess calcium supplements - Reduction of sodium intake (<2 g/day) to lower urinary calcium excretion. - Moderation of oxalate-rich foods (nuts, spinach, beets, chocolate, tea)  - Limit animal protein (meat, fish, poultry); promote balanced diet with fruits/vegetables.   - Will order CT stone protocol considering left flank pain-although I suspect this is non-GU in origin considering chronicity

## 2024-02-18 ENCOUNTER — Other Ambulatory Visit: Payer: Self-pay

## 2024-02-18 ENCOUNTER — Ambulatory Visit
Admission: RE | Admit: 2024-02-18 | Discharge: 2024-02-18 | Disposition: A | Discharge status: Home / Self Care | Payer: Self-pay | Attending: Urology | Admitting: Urology

## 2024-02-18 ENCOUNTER — Ambulatory Visit: Payer: Self-pay | Admitting: Urology

## 2024-02-18 ENCOUNTER — Ambulatory Visit
Admission: RE | Admit: 2024-02-18 | Discharge: 2024-02-18 | Disposition: A | Discharge status: Home / Self Care | Payer: Self-pay | Source: Ambulatory Visit | Attending: Urology | Admitting: Urology

## 2024-02-18 VITALS — BP 113/80 | HR 52 | Ht 61.0 in | Wt 146.0 lb

## 2024-02-18 DIAGNOSIS — N200 Calculus of kidney: Secondary | ICD-10-CM

## 2024-02-18 DIAGNOSIS — N939 Abnormal uterine and vaginal bleeding, unspecified: Secondary | ICD-10-CM | POA: Insufficient documentation

## 2024-02-18 NOTE — Assessment & Plan Note (Signed)
 3 mo history of vaginal spotting w/ wiping  On PO estradiol    Will refer to gynecology for evaluation, has been >3 years since last exam.

## 2024-02-18 NOTE — Patient Instructions (Signed)
 Schedule [phone]

## 2024-02-19 ENCOUNTER — Ambulatory Visit: Payer: Self-pay | Admitting: Urology

## 2024-02-22 ENCOUNTER — Ambulatory Visit
Admission: RE | Admit: 2024-02-22 | Discharge: 2024-02-22 | Disposition: A | Discharge status: Home / Self Care | Payer: Self-pay | Source: Ambulatory Visit | Attending: Urology | Admitting: Urology

## 2024-02-22 DIAGNOSIS — N200 Calculus of kidney: Secondary | ICD-10-CM | POA: Insufficient documentation

## 2024-04-08 ENCOUNTER — Encounter: Payer: Self-pay | Admitting: Licensed Practical Nurse

## 2024-04-08 ENCOUNTER — Ambulatory Visit: Payer: Self-pay | Admitting: Licensed Practical Nurse

## 2024-04-08 ENCOUNTER — Other Ambulatory Visit (HOSPITAL_COMMUNITY)
Admission: RE | Admit: 2024-04-08 | Discharge: 2024-04-08 | Disposition: A | Discharge status: Home / Self Care | Payer: Self-pay | Source: Ambulatory Visit | Attending: Licensed Practical Nurse | Admitting: Licensed Practical Nurse

## 2024-04-08 ENCOUNTER — Other Ambulatory Visit: Payer: Self-pay

## 2024-04-08 VITALS — BP 119/79 | HR 73 | Ht <= 58 in | Wt 149.9 lb

## 2024-04-08 DIAGNOSIS — N951 Menopausal and female climacteric states: Secondary | ICD-10-CM

## 2024-04-08 DIAGNOSIS — N921 Excessive and frequent menstruation with irregular cycle: Secondary | ICD-10-CM

## 2024-04-08 DIAGNOSIS — N920 Excessive and frequent menstruation with regular cycle: Secondary | ICD-10-CM

## 2024-04-08 DIAGNOSIS — Z30011 Encounter for initial prescription of contraceptive pills: Secondary | ICD-10-CM

## 2024-04-08 DIAGNOSIS — Z113 Encounter for screening for infections with a predominantly sexual mode of transmission: Secondary | ICD-10-CM

## 2024-04-08 LAB — POCT URINALYSIS DIPSTICK
Bilirubin, UA: NEGATIVE
Blood, UA: NEGATIVE
Glucose, UA: NEGATIVE
Ketones, UA: NEGATIVE
Leukocytes, UA: NEGATIVE
Nitrite, UA: NEGATIVE
Protein, UA: NEGATIVE
Spec Grav, UA: 1.02 (ref 1.010–1.025)
Urobilinogen, UA: 0.2 U/dL
pH, UA: 7.5 (ref 5.0–8.0)

## 2024-04-08 MED ORDER — DESOGESTREL-ETHINYL ESTRADIOL 0.15-30 MG-MCG PO TABS
1.0000 | ORAL_TABLET | Freq: Every day | ORAL | 11 refills | Status: AC
  Filled 2024-04-08: qty 28, 28d supply, fill #0
  Filled 2024-05-14: qty 28, 28d supply, fill #1
  Filled 2024-05-14: qty 84, 84d supply, fill #1

## 2024-04-08 MED ORDER — ESTRADIOL 0.01 % VA CREA
1.0000 | TOPICAL_CREAM | Freq: Every day | VAGINAL | 12 refills | Status: AC
  Filled 2024-04-08: qty 42.5, 30d supply, fill #0

## 2024-04-08 NOTE — Progress Notes (Unsigned)
 Theotis Haze ORN, NP   No chief complaint on file.   HPI:      Colleen Warner is a 48 y.o. [id number] whose LMP was No LMP recorded., presents today for Spotting while on birth control, prescribed by PCP at [facility], she has not talked to them about this   Has been on pills for 13 years, current prescription (Norgestimate -Eth Estradiol  (ORTHO TRI-CYCLEN LO) 0.18/0.215/0.25 MG-25 MCG TABS;  over 1 year, takes pils daily at the same time, has not missed a pill. About 6 months ago, previous cycle at the 4th week, now every 15 to days has spotting, period used to be light, now period is heavy, cycles are monthly at the end of pills, lasting 5 days, heavy-using thickest pad  change every 1.5hr, sometimes when sleeping clothes do get stained, bleeding like a faucet, does pass clots during the night, denies cramping. Heavy cycles have been going on for the last 1.5years   Spotting every 2 weeks last a couple hours   Menopause sxs: hot flashes., sweat at night,  Is not sure when the women went through menopause  I feel like I have dryness near my urethra, burning with urination about 4 months  Pain in left back-MD d/t arthtirs, sometimes whn ai sneeze/cough I leak urine   Is sexual active 1 female partner, no pain but does notice burning  Bloating: about 1 year, up to 3 times a week, depends on what shee eats esp dairy  Her daughter has heavy and painful periods   Denies CHTN, tobacco/nicotine use or migraines      Patient Active Problem List   Diagnosis Date Noted   Vaginal bleeding 02/18/2024   Nephrolithiasis 02/13/2024   Hematuria, microscopic 09/17/2016   Irregular menstrual cycle 01/21/2016   OCP (oral contraceptive pills) initiation 01/21/2016   Tinea capitis 01/21/2016   LLQ abdominal pain 03/09/2015   Decreased visual acuity 06/09/2014   Allergic rhinitis 06/09/2014   Carpal tunnel syndrome, bilateral 06/09/2014   Right flank pain 04/21/2013   Bloating 12/23/2012    Bartholin cyst 10/04/2012   Depression 12/16/2010    Past Surgical History:  Procedure Laterality Date   KIDNEY STONE SURGERY Right 2015    Family History  Problem Relation Age of Onset   Cancer Father    Alzheimer's disease Mother     Social History   Socioeconomic History   Marital status: Married    Spouse name: Not on file   Number of children: 3   Years of education: Not on file   Highest education level: 6th grade  Occupational History   Not on file  Tobacco Use   Smoking status: Never   Smokeless tobacco: Never  Vaping Use   Vaping status: Never Used  Substance and Sexual Activity   Alcohol use: Not Currently    Comment: occasional - ETOH with pregnancy   Drug use: No   Sexual activity: Yes    Birth control/protection: Pill  Other Topics Concern   Not on file  Social History Narrative   Not on file   Social Drivers of Health   Financial Resource Strain: Not on file  Food Insecurity: No Food Insecurity (11/10/2021)   Hunger Vital Sign    Worried About Running Out of Food in the Last Year: Never true    Ran Out of Food in the Last Year: Never true  Transportation Needs: No Transportation Needs (11/10/2021)   PRAPARE - Transportation    Lack of  Transportation (Medical): No    Lack of Transportation (Non-Medical): No  Physical Activity: Not on file  Stress: Not on file  Social Connections: Not on file  Intimate Partner Violence: Not on file    Outpatient Medications Prior to Visit  Medication Sig Dispense Refill   Norgestimate -Eth Estradiol  (ORTHO TRI-CYCLEN LO) 0.18/0.215/0.25 MG-25 MCG TABS Take 1 tablet by mouth daily. 90 tablet 3   meloxicam  (MOBIC ) 7.5 MG tablet Take 1 tablet (7.5 mg total) by mouth daily. (Patient not taking: Reported on 04/08/2024) 30 tablet 0   No facility-administered medications prior to visit.      ROS:  Review of Systems   OBJECTIVE:   Vitals:  BP 119/79   Pulse 73   Ht 4' 9 (1.448 m)   Wt 149 lb 14.4 oz  (68 kg)   BMI 32.44 kg/m   Physical Exam  Results: No results found for this or any previous visit (from the past 24 hours).   Assessment/Plan: 1. Encounter for initial prescription of contraceptive pills - desogestrel -ethinyl estradiol  (APRI ) 0.15-30 MG-MCG tablet; Take 1 tablet by mouth daily.  Dispense: 28 tablet; Refill: 11  2. Menopausal symptom - estradiol  (ESTRACE ) 0.01 % CREA vaginal cream; Place 1 Applicatorful vaginally at bedtime. Use nightly times 2 weeks then use twice a week  Dispense: 42.5 g; Refill: 12  3. Menorrhagia with irregular cycle - CBC w/Diff/Platelet  4. Screening examination for STD (sexually transmitted disease) - Cervicovaginal ancillary only  5. Menorrhagia with regular cycle (Primary) - US  PELVIS TRANSVAGINAL NON-OB (TV ONLY); Future     Meds ordered this encounter  Medications   desogestrel -ethinyl estradiol  (APRI ) 0.15-30 MG-MCG tablet    Sig: Take 1 tablet by mouth daily.    Dispense:  28 tablet    Refill:  11   estradiol  (ESTRACE ) 0.01 % CREA vaginal cream    Sig: Place 1 Applicatorful vaginally at bedtime. Use nightly times 2 weeks then use twice a week    Dispense:  42.5 g    Refill:  12     Cleopha Indelicato M Kodah Maret, CNM 04/08/2024 3:21 PM

## 2024-04-09 LAB — CBC WITH DIFFERENTIAL/PLATELET
Basophils Absolute: 0 x10E3/uL (ref 0.0–0.2)
Basos: 0 %
EOS (ABSOLUTE): 0.1 x10E3/uL (ref 0.0–0.4)
Eos: 1 %
Hematocrit: 37.4 % (ref 34.0–46.6)
Hemoglobin: 12.3 g/dL (ref 11.1–15.9)
Immature Grans (Abs): 0 x10E3/uL (ref 0.0–0.1)
Immature Granulocytes: 0 %
Lymphocytes Absolute: 2.8 x10E3/uL (ref 0.7–3.1)
Lymphs: 35 %
MCH: 29.6 pg (ref 26.6–33.0)
MCHC: 32.9 g/dL (ref 31.5–35.7)
MCV: 90 fL (ref 79–97)
Monocytes Absolute: 0.6 x10E3/uL (ref 0.1–0.9)
Monocytes: 7 %
Neutrophils Absolute: 4.5 x10E3/uL (ref 1.4–7.0)
Neutrophils: 57 %
Platelets: 250 x10E3/uL (ref 150–450)
RBC: 4.15 x10E6/uL (ref 3.77–5.28)
RDW: 13 % (ref 11.7–15.4)
WBC: 7.9 x10E3/uL (ref 3.4–10.8)

## 2024-04-10 LAB — CERVICOVAGINAL ANCILLARY ONLY
Bacterial Vaginitis (gardnerella): NEGATIVE
Candida Glabrata: NEGATIVE
Candida Vaginitis: NEGATIVE
Chlamydia: NEGATIVE
Comment: NEGATIVE
Comment: NEGATIVE
Comment: NEGATIVE
Comment: NEGATIVE
Comment: NEGATIVE
Comment: NORMAL
Neisseria Gonorrhea: NEGATIVE
Trichomonas: NEGATIVE

## 2024-04-17 ENCOUNTER — Inpatient Hospital Stay
Admission: RE | Admit: 2024-04-17 | Discharge: 2024-04-17 | Payer: Self-pay | Attending: Licensed Practical Nurse | Admitting: Licensed Practical Nurse

## 2024-04-17 DIAGNOSIS — N920 Excessive and frequent menstruation with regular cycle: Secondary | ICD-10-CM

## 2024-04-22 ENCOUNTER — Telehealth: Payer: Self-pay

## 2024-04-22 ENCOUNTER — Other Ambulatory Visit: Payer: Self-pay

## 2024-04-22 NOTE — Telephone Encounter (Signed)
 TRIAGE VOICEMAIL: Daughter calling for patient. Patient would like a call back (spanish) or with an interpreter. She received her 04/17/24 U/S results in my chart, but doesn't understand them. She would like them to be explained in further detail.

## 2024-04-30 ENCOUNTER — Ambulatory Visit: Payer: Self-pay | Admitting: Licensed Practical Nurse

## 2024-05-14 ENCOUNTER — Other Ambulatory Visit: Payer: Self-pay

## 2024-05-15 ENCOUNTER — Other Ambulatory Visit: Payer: Self-pay
